# Patient Record
Sex: Female | Born: 1980 | Race: Black or African American | Hispanic: No | Marital: Single | Smoking: Current every day smoker
Health system: Southern US, Community
[De-identification: ages and names within clinical notes are randomized; demographics above are authoritative.]

## PROBLEM LIST (undated history)

## (undated) ENCOUNTER — Inpatient Hospital Stay (HOSPITAL_COMMUNITY): Payer: Self-pay

## (undated) DIAGNOSIS — O24419 Gestational diabetes mellitus in pregnancy, unspecified control: Secondary | ICD-10-CM

## (undated) DIAGNOSIS — D649 Anemia, unspecified: Secondary | ICD-10-CM

## (undated) DIAGNOSIS — I10 Essential (primary) hypertension: Secondary | ICD-10-CM

## (undated) DIAGNOSIS — R519 Headache, unspecified: Secondary | ICD-10-CM

## (undated) HISTORY — PX: EYE SURGERY: SHX253

## (undated) HISTORY — DX: Anemia, unspecified: D64.9

## (undated) HISTORY — PX: DILATION AND CURETTAGE OF UTERUS: SHX78

---
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
## 2002-02-27 ENCOUNTER — Inpatient Hospital Stay: Admission: AD | Admit: 2002-02-27 | Discharge: 2002-02-27 | Payer: Self-pay | Admitting: Obstetrics and Gynecology

## 2003-02-26 ENCOUNTER — Emergency Department (HOSPITAL_COMMUNITY): Admission: EM | Admit: 2003-02-26 | Discharge: 2003-02-26 | Payer: Self-pay | Admitting: Emergency Medicine

## 2004-03-09 ENCOUNTER — Inpatient Hospital Stay (HOSPITAL_COMMUNITY): Admission: AD | Admit: 2004-03-09 | Discharge: 2004-03-09 | Payer: Self-pay | Admitting: Obstetrics

## 2005-01-18 ENCOUNTER — Emergency Department (HOSPITAL_COMMUNITY): Admission: EM | Admit: 2005-01-18 | Discharge: 2005-01-18 | Payer: Self-pay | Admitting: Family Medicine

## 2005-11-04 ENCOUNTER — Inpatient Hospital Stay (HOSPITAL_COMMUNITY): Admission: AD | Admit: 2005-11-04 | Discharge: 2005-11-04 | Payer: Self-pay | Admitting: Obstetrics

## 2006-02-08 ENCOUNTER — Emergency Department (HOSPITAL_COMMUNITY): Admission: EM | Admit: 2006-02-08 | Discharge: 2006-02-08 | Payer: Self-pay | Admitting: Emergency Medicine

## 2006-05-04 ENCOUNTER — Emergency Department (HOSPITAL_COMMUNITY): Admission: EM | Admit: 2006-05-04 | Discharge: 2006-05-04 | Payer: Self-pay | Admitting: Family Medicine

## 2007-01-12 ENCOUNTER — Ambulatory Visit (HOSPITAL_COMMUNITY): Admission: RE | Admit: 2007-01-12 | Discharge: 2007-01-12 | Payer: Self-pay | Admitting: Obstetrics & Gynecology

## 2007-03-30 ENCOUNTER — Inpatient Hospital Stay (HOSPITAL_COMMUNITY): Admission: AD | Admit: 2007-03-30 | Discharge: 2007-03-30 | Payer: Self-pay | Admitting: Obstetrics & Gynecology

## 2007-04-16 ENCOUNTER — Ambulatory Visit (HOSPITAL_COMMUNITY): Admission: RE | Admit: 2007-04-16 | Discharge: 2007-04-16 | Payer: Self-pay | Admitting: Obstetrics & Gynecology

## 2007-05-24 ENCOUNTER — Inpatient Hospital Stay (HOSPITAL_COMMUNITY): Admission: RE | Admit: 2007-05-24 | Discharge: 2007-05-26 | Payer: Self-pay | Admitting: Obstetrics & Gynecology

## 2009-11-22 ENCOUNTER — Emergency Department (HOSPITAL_COMMUNITY): Admission: EM | Admit: 2009-11-22 | Discharge: 2009-11-22 | Payer: Self-pay | Admitting: Family Medicine

## 2010-03-11 ENCOUNTER — Emergency Department (HOSPITAL_COMMUNITY)
Admission: EM | Admit: 2010-03-11 | Discharge: 2010-03-11 | Payer: Self-pay | Source: Home / Self Care | Admitting: Family Medicine

## 2010-05-11 ENCOUNTER — Inpatient Hospital Stay (INDEPENDENT_AMBULATORY_CARE_PROVIDER_SITE_OTHER)
Admission: RE | Admit: 2010-05-11 | Discharge: 2010-05-11 | Disposition: A | Discharge status: Home / Self Care | Payer: Medicaid Other | Source: Ambulatory Visit | Attending: Family Medicine | Admitting: Family Medicine

## 2010-05-11 DIAGNOSIS — L02219 Cutaneous abscess of trunk, unspecified: Secondary | ICD-10-CM

## 2010-05-13 LAB — CULTURE, ROUTINE-ABSCESS

## 2010-07-20 NOTE — H&P (Signed)
NAMEJAVANA, [name] NO.:  [id number]   MEDICAL RECORD NO.:  [id number]          PATIENT TYPE:  INP   LOCATION:  9167                          FACILITY:  [facility]   PHYSICIAN:  Roseanna Rainbow, M.D.DATE OF BIRTH:  10-Jul-1980   DATE OF ADMISSION:  05/24/2007  DATE OF DISCHARGE:                              HISTORY & PHYSICAL   CHIEF COMPLAINT:  The patient is a 30 year old, P2 with an estimated  date of confinement of May 31, 2007, with an intrauterine pregnancy at  97 plus weeks with a history of chronic hypertension for induction of  labor.   HISTORY OF PRESENT ILLNESS:  Please see the above.   ALLERGIES:  No known drug allergies.   MEDICATIONS:  Please see the medication reconciliation form.   OB RISK FACTORS:  Rh negative, nonsensitized, iron-deficiency anemia and  chronic hypertension.   PRENATAL LABORATORY DATA:  Quad screen normal.  Chlamydia probe  negative.  Pap smear negative.  GC probe negative.  One-hour GTT 144.  Normal 3-hour GTT.  Hepatitis B surface antigen negative.  Hematocrit  36, hemoglobin 11.9.  HIV nonreactive.  Cystic fibrosis carrier testing  negative.  Platelets 260,000.  Blood type is A negative.  Antibody  screen was negative.  RPR nonreactive, rubella immune.  Sickle cell  negative.  GBS negative on February 20.   PAST GYNECOLOGICAL HISTORY:  History of D&C.   PAST MEDICAL HISTORY:  Please see the above.   PAST SURGICAL HISTORY:  No previous surgery.   SOCIAL HISTORY:  She is a Scientist, physiological.  She is single.  Denies any  alcohol, tobacco or drug use.   FAMILY HISTORY:  Adult-onset diabetes, Alzheimer's and hypertension.   PAST OBSTETRICAL HISTORY:  In January 2002, she delivered a live born  female, full term, 7 pounds 6 ounces, vaginal delivery.  In March 2000,  she delivered a live born female at 36 weeks, weight 5 pounds, vaginal  delivery.  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   PHYSICAL  EXAMINATION:  VITAL SIGNS:  Afebrile.  Blood pressure is  130/70s-80s.  Fetal heart tracing reassuring.  Tocodynamometer with  contractions every 2 minutes.  PELVIC:  Sterile vaginal exam with cervix 4 cm dilated, 80% effaced with  vertex at -2 station.  The membranes were artificially ruptured for  clear fluid.   ASSESSMENT:  1. Multipara at term.  2. History of chronic hypertension.  3. Early labor.  Fetal heart tracing consistent with fetal well-being.   PLAN:  Admission.  Continue induction of labor with low-dose Pitocin as  needed.      Roseanna Rainbow, M.D.  Electronically Signed     LAJ/MEDQ  D:  05/24/2007  T:  05/24/2007  Job:  [id number]

## 2010-08-18 ENCOUNTER — Emergency Department (HOSPITAL_COMMUNITY)
Admission: EM | Admit: 2010-08-18 | Discharge: 2010-08-18 | Disposition: A | Discharge status: Home / Self Care | Payer: No Typology Code available for payment source | Attending: Emergency Medicine | Admitting: Emergency Medicine

## 2010-08-18 DIAGNOSIS — S335XXA Sprain of ligaments of lumbar spine, initial encounter: Secondary | ICD-10-CM | POA: Insufficient documentation

## 2010-08-18 DIAGNOSIS — M545 Low back pain, unspecified: Secondary | ICD-10-CM | POA: Insufficient documentation

## 2010-08-19 ENCOUNTER — Emergency Department (HOSPITAL_COMMUNITY)
Admission: EM | Admit: 2010-08-19 | Discharge: 2010-08-19 | Disposition: A | Discharge status: Home / Self Care | Payer: No Typology Code available for payment source | Attending: Emergency Medicine | Admitting: Emergency Medicine

## 2010-08-19 DIAGNOSIS — M542 Cervicalgia: Secondary | ICD-10-CM | POA: Insufficient documentation

## 2010-08-19 DIAGNOSIS — S139XXA Sprain of joints and ligaments of unspecified parts of neck, initial encounter: Secondary | ICD-10-CM | POA: Insufficient documentation

## 2010-08-19 DIAGNOSIS — X58XXXA Exposure to other specified factors, initial encounter: Secondary | ICD-10-CM | POA: Insufficient documentation

## 2010-10-04 ENCOUNTER — Emergency Department (HOSPITAL_COMMUNITY)
Admission: EM | Admit: 2010-10-04 | Discharge: 2010-10-05 | Disposition: A | Discharge status: Home / Self Care | Payer: Medicaid Other | Attending: Emergency Medicine | Admitting: Emergency Medicine

## 2010-10-04 DIAGNOSIS — IMO0002 Reserved for concepts with insufficient information to code with codable children: Secondary | ICD-10-CM | POA: Insufficient documentation

## 2010-10-21 ENCOUNTER — Ambulatory Visit: Payer: Medicaid Other | Attending: Family Medicine | Admitting: Rehabilitative and Restorative Service Providers"

## 2010-10-21 ENCOUNTER — Ambulatory Visit: Payer: No Typology Code available for payment source

## 2010-10-21 DIAGNOSIS — M2569 Stiffness of other specified joint, not elsewhere classified: Secondary | ICD-10-CM | POA: Insufficient documentation

## 2010-10-21 DIAGNOSIS — IMO0001 Reserved for inherently not codable concepts without codable children: Secondary | ICD-10-CM | POA: Insufficient documentation

## 2010-10-21 DIAGNOSIS — M542 Cervicalgia: Secondary | ICD-10-CM | POA: Insufficient documentation

## 2010-10-21 DIAGNOSIS — R293 Abnormal posture: Secondary | ICD-10-CM | POA: Insufficient documentation

## 2010-10-25 ENCOUNTER — Encounter: Payer: No Typology Code available for payment source | Admitting: Rehabilitative and Restorative Service Providers"

## 2010-10-28 ENCOUNTER — Encounter: Payer: No Typology Code available for payment source | Admitting: Rehabilitative and Restorative Service Providers"

## 2010-11-02 ENCOUNTER — Encounter: Payer: No Typology Code available for payment source | Admitting: Physical Therapy

## 2010-11-03 ENCOUNTER — Ambulatory Visit: Payer: Medicaid Other | Admitting: Physical Therapy

## 2010-11-10 ENCOUNTER — Ambulatory Visit: Payer: Medicaid Other | Attending: Family Medicine | Admitting: Physical Therapy

## 2010-11-10 DIAGNOSIS — IMO0001 Reserved for inherently not codable concepts without codable children: Secondary | ICD-10-CM | POA: Insufficient documentation

## 2010-11-10 DIAGNOSIS — R293 Abnormal posture: Secondary | ICD-10-CM | POA: Insufficient documentation

## 2010-11-10 DIAGNOSIS — M2569 Stiffness of other specified joint, not elsewhere classified: Secondary | ICD-10-CM | POA: Insufficient documentation

## 2010-11-10 DIAGNOSIS — M542 Cervicalgia: Secondary | ICD-10-CM | POA: Insufficient documentation

## 2010-11-15 ENCOUNTER — Ambulatory Visit: Payer: Medicaid Other | Admitting: Physical Therapy

## 2010-11-17 ENCOUNTER — Encounter: Payer: No Typology Code available for payment source | Admitting: Physical Therapy

## 2010-11-23 ENCOUNTER — Ambulatory Visit: Payer: Medicaid Other | Admitting: Rehabilitative and Restorative Service Providers"

## 2010-11-25 LAB — RH IMMUNE GLOBULIN WORKUP (NOT WOMEN'S HOSP)

## 2010-11-29 LAB — CBC
HCT: 32 — ABNORMAL LOW
Hemoglobin: 10.8 — ABNORMAL LOW
Platelets: 245
RBC: 3.78 — ABNORMAL LOW
RBC: 4.28
WBC: 12.5 — ABNORMAL HIGH
WBC: 6.8

## 2010-11-29 LAB — RPR: RPR Ser Ql: NONREACTIVE

## 2010-11-29 LAB — RH IMMUNE GLOB WKUP(>/=20WKS)(NOT WOMEN'S HOSP)

## 2010-11-30 ENCOUNTER — Encounter: Payer: No Typology Code available for payment source | Admitting: Rehabilitative and Restorative Service Providers"

## 2010-12-01 ENCOUNTER — Ambulatory Visit: Payer: Medicaid Other | Admitting: Physical Therapy

## 2010-12-06 ENCOUNTER — Ambulatory Visit: Payer: Medicaid Other | Attending: Family Medicine | Admitting: Physical Therapy

## 2010-12-06 DIAGNOSIS — R293 Abnormal posture: Secondary | ICD-10-CM | POA: Insufficient documentation

## 2010-12-06 DIAGNOSIS — IMO0001 Reserved for inherently not codable concepts without codable children: Secondary | ICD-10-CM | POA: Insufficient documentation

## 2010-12-06 DIAGNOSIS — M542 Cervicalgia: Secondary | ICD-10-CM | POA: Insufficient documentation

## 2010-12-06 DIAGNOSIS — M2569 Stiffness of other specified joint, not elsewhere classified: Secondary | ICD-10-CM | POA: Insufficient documentation

## 2010-12-08 ENCOUNTER — Encounter: Payer: No Typology Code available for payment source | Admitting: Physical Therapy

## 2010-12-13 ENCOUNTER — Encounter: Payer: No Typology Code available for payment source | Admitting: Physical Therapy

## 2010-12-15 ENCOUNTER — Encounter: Payer: No Typology Code available for payment source | Admitting: Physical Therapy

## 2010-12-21 ENCOUNTER — Ambulatory Visit: Payer: Medicaid Other | Admitting: Physical Therapy

## 2010-12-23 ENCOUNTER — Ambulatory Visit: Payer: No Typology Code available for payment source

## 2010-12-27 ENCOUNTER — Encounter: Payer: No Typology Code available for payment source | Admitting: Physical Therapy

## 2011-03-18 ENCOUNTER — Encounter (HOSPITAL_COMMUNITY): Payer: Self-pay | Admitting: Emergency Medicine

## 2011-03-18 ENCOUNTER — Emergency Department (HOSPITAL_COMMUNITY)
Admission: EM | Admit: 2011-03-18 | Discharge: 2011-03-18 | Disposition: A | Discharge status: Nursing Home (Includes ICF) | Payer: Medicaid Other | Source: Home / Self Care | Attending: Emergency Medicine | Admitting: Emergency Medicine

## 2011-03-18 DIAGNOSIS — J029 Acute pharyngitis, unspecified: Secondary | ICD-10-CM

## 2011-03-18 DIAGNOSIS — K1370 Unspecified lesions of oral mucosa: Secondary | ICD-10-CM

## 2011-03-18 DIAGNOSIS — K1379 Other lesions of oral mucosa: Secondary | ICD-10-CM

## 2011-03-18 LAB — POCT RAPID STREP A: Streptococcus, Group A Screen (Direct): NEGATIVE

## 2011-03-18 NOTE — ED Provider Notes (Signed)
History     CSN: [id number]  Arrival date & time 03/18/11  4098   First MD Initiated Contact with Patient 03/18/11 1003      Chief Complaint  Patient presents with  . Sore Throat  . Influenza     HPI Comments: Pt with ST starting several days ago. Also with atruamatic swelling upper right soft palate, bodyaches, fatigue. No voice changes, difficulty swallowing, trismus, drooling, ear pain, fevers, rash, abd pain. Has not tried anything for this.   Patient is a 31 y.o. female presenting with pharyngitis and flu symptoms. The history is provided by the patient.  Sore Throat This is a new problem. The symptoms are aggravated by swallowing.  Influenza    History reviewed. No pertinent past medical history.  Past Surgical History  Procedure Date  . Tubal ligation     History reviewed. No pertinent family history.  History  Substance Use Topics  . Smoking status: Never Smoker   . Smokeless tobacco: Not on file  . Alcohol Use: No    OB History    Grav Para Term Preterm Abortions TAB SAB Ect Mult Living                  Review of Systems  Constitutional: Positive for fatigue. Negative for fever.  HENT: Positive for sore throat and neck pain. Negative for congestion, rhinorrhea, drooling, mouth sores, trouble swallowing, dental problem, voice change, postnasal drip and ear discharge.   Gastrointestinal: Negative for nausea.  Skin: Negative for rash.    Allergies  Review of patient's allergies indicates no known allergies.  Home Medications  No current outpatient prescriptions on file.  BP 104/64  Pulse 81  Temp(Src) 97.9 F (36.6 C) (Oral)  Resp 16  SpO2 100%  LMP 03/16/2011  Physical Exam  Nursing note and vitals reviewed. Constitutional: She is oriented to person, place, and time. She appears well-developed and well-nourished. No distress.  HENT:  Head: Normocephalic and atraumatic. No trismus in the jaw.  Right Ear: Tympanic membrane normal.  Left  Ear: Tympanic membrane normal.  Mouth/Throat: Uvula is midline and mucous membranes are normal. No oral lesions.         2.5 x 2.5 nontender flucutant mass where soft and hard palate meet, some exudates tonsils. Otherwise tonsils WNL. Voice normal  Eyes: Conjunctivae and EOM are normal. Pupils are equal, round, and reactive to light.  Neck: Normal range of motion. Neck supple.  Cardiovascular: Regular rhythm.   Pulmonary/Chest: Effort normal.  Abdominal: She exhibits no distension.  Musculoskeletal: Normal range of motion.  Lymphadenopathy:    She has no cervical adenopathy.  Neurological: She is alert and oriented to person, place, and time.  Skin: Skin is warm and dry.  Psychiatric: She has a normal mood and affect. Her behavior is normal. Judgment and thought content normal.    ED Course  Procedures (including critical care time)   Labs Reviewed  POCT RAPID STREP A ([facility] ONLY)   No results found.   1. Soft palate edema   2. Pharyngitis       MDM  Concern for atypical presentation of peritonsillar abscess. Pt declined medication here. Transferring for further evaluation.   Luiz Blare, MD 03/18/11 1108

## 2011-03-18 NOTE — ED Notes (Signed)
HERE WITH SORE THROAT,DIFF SWALLOWING AND NOW BODY ACHES THAT STARTED YESTERDAY.DENIES FEVER,CHILLS,N,V.ON EXAM PPT HAS ABSCESS TO RIGHT UVULA BUT DENIES PAIN WITH TOUCH.PT ALSO REPORT OF INTERMITT H/A

## 2011-03-20 ENCOUNTER — Encounter (HOSPITAL_COMMUNITY): Payer: Self-pay | Admitting: *Deleted

## 2011-03-20 ENCOUNTER — Emergency Department (HOSPITAL_COMMUNITY)
Admission: EM | Admit: 2011-03-20 | Discharge: 2011-03-20 | Disposition: A | Discharge status: Home / Self Care | Payer: Medicaid Other | Attending: Emergency Medicine | Admitting: Emergency Medicine

## 2011-03-20 DIAGNOSIS — R220 Localized swelling, mass and lump, head: Secondary | ICD-10-CM | POA: Insufficient documentation

## 2011-03-20 DIAGNOSIS — J029 Acute pharyngitis, unspecified: Secondary | ICD-10-CM | POA: Insufficient documentation

## 2011-03-20 MED ORDER — AMOXICILLIN-POT CLAVULANATE 500-125 MG PO TABS: 1.0000 | ORAL_TABLET | Freq: Three times a day (TID) | ORAL | Status: AC

## 2011-03-20 NOTE — ED Notes (Signed)
Pt from home with c/o sore throat for 2-3 days, denies fever or head congestion, "hurts when I swallow".

## 2011-03-20 NOTE — ED Provider Notes (Signed)
History     CSN: [id number]  Arrival date & time 03/20/11  1012   First MD Initiated Contact with Patient 03/20/11 1024      Chief Complaint  Patient presents with  . Sore Throat    (Consider location/radiation/quality/duration/timing/severity/associated sxs/prior treatment) HPI Comments: Patient comes in today with chief complaint of sore throat.  She was seen by [facility] 2 days ago for same complaint and was recommended to follow up in the ED due to a concern for atypical peritonsillar abscess.  Review of the note from [facility] shows that a mass was identified on the soft palate.  Patient is unaware of the mass and does not know how long it has been there.  Patient has had a sore throat for the past 2 days.  Some pain with swallowing.  No trismus, no difficulty swallowing, no drooling, no fever.  She reports that her pain have improved since she was seen by [facility] two days ago.  Patient is a 31 y.o. female presenting with pharyngitis. The history is provided by the patient.  Sore Throat Associated symptoms include a sore throat. Pertinent negatives include no chills, congestion, coughing, fever, neck pain or rash.    History reviewed. No pertinent past medical history.  History reviewed. No pertinent past surgical history.  Family History  Problem Relation Age of Onset  . Hyperlipidemia Mother     History  Substance Use Topics  . Smoking status: Current Some Day Smoker    Types: Cigarettes  . Smokeless tobacco: Never Used  . Alcohol Use: Yes     occ    OB History    Grav Para Term Preterm Abortions TAB SAB Ect Mult Living                  Review of Systems  Constitutional: Negative for fever and chills.  HENT: Positive for sore throat. Negative for ear pain, congestion, rhinorrhea, drooling, trouble swallowing, neck pain, neck stiffness, voice change and sinus pressure.   Respiratory: Negative for cough and shortness of breath.   Skin: Negative for rash.    Allergies    Review of patient's allergies indicates no known allergies.  Home Medications  No current outpatient prescriptions on file.  BP 122/73  Pulse 103  Temp(Src) 97.9 F (36.6 C) (Oral)  Resp 18  SpO2 100%  LMP 03/16/2011  Physical Exam  Nursing note and vitals reviewed. Constitutional: She is oriented to person, place, and time. She appears well-developed and well-nourished. No distress.  HENT:  Head: Normocephalic and atraumatic. No trismus in the jaw.  Right Ear: Tympanic membrane normal.  Left Ear: Tympanic membrane normal.  Nose: Mucosal edema present.  Mouth/Throat: Uvula is midline and mucous membranes are normal. No uvula swelling. Posterior oropharyngeal erythema present. No oropharyngeal exudate, posterior oropharyngeal edema or tonsillar abscesses.    Neck: Normal range of motion. Neck supple.  Cardiovascular: Normal rate, regular rhythm and normal heart sounds.   Pulmonary/Chest: Effort normal and breath sounds normal.  Neurological: She is alert and oriented to person, place, and time.  Skin: Skin is warm and dry. She is not diaphoretic. No erythema.  Psychiatric: She has a normal mood and affect.    ED Course  Procedures (including critical care time)   Labs Reviewed  RAPID STREP SCREEN   No results found.   No diagnosis found.  11:25 AM Discussed patient with Dr. Annalee Genta with ENT.  He recommends placing the patient on Augmentin and following up with patient  in the office tomorrow.    MDM  Patient not exhibiting any signs or symptoms of peritonsillar abscess. Patient not in any acute distress.  She does have a soft palate mass on the roof of her mouth that feels fluctuant.  Will have patient follow up with ENT for further evaluation and management.        [facility] 03/20/11 2123

## 2011-03-20 NOTE — ED Notes (Signed)
MD at bedside. 

## 2011-03-23 NOTE — ED Provider Notes (Signed)
Medical screening examination/treatment/procedure(s) were performed by non-physician practitioner and as supervising physician I was immediately available for consultation/collaboration.  Toy Baker, MD 03/23/11 [phone]

## 2011-03-24 ENCOUNTER — Ambulatory Visit (INDEPENDENT_AMBULATORY_CARE_PROVIDER_SITE_OTHER): Payer: Medicaid Other | Admitting: Obstetrics & Gynecology

## 2011-03-24 ENCOUNTER — Encounter: Payer: Self-pay | Admitting: Obstetrics & Gynecology

## 2011-03-24 ENCOUNTER — Other Ambulatory Visit (HOSPITAL_COMMUNITY)
Admission: RE | Admit: 2011-03-24 | Discharge: 2011-03-24 | Disposition: A | Discharge status: Home / Self Care | Payer: Medicaid Other | Source: Ambulatory Visit | Attending: Obstetrics & Gynecology | Admitting: Obstetrics & Gynecology

## 2011-03-24 VITALS — BP 128/82 | HR 84 | Temp 99.1°F | Ht 65.0 in | Wt 178.6 lb

## 2011-03-24 DIAGNOSIS — Z113 Encounter for screening for infections with a predominantly sexual mode of transmission: Secondary | ICD-10-CM | POA: Insufficient documentation

## 2011-03-24 DIAGNOSIS — Z308 Encounter for other contraceptive management: Secondary | ICD-10-CM

## 2011-03-24 DIAGNOSIS — Z30019 Encounter for initial prescription of contraceptives, unspecified: Secondary | ICD-10-CM

## 2011-03-24 DIAGNOSIS — R87810 Cervical high risk human papillomavirus (HPV) DNA test positive: Secondary | ICD-10-CM | POA: Insufficient documentation

## 2011-03-24 DIAGNOSIS — Z309 Encounter for contraceptive management, unspecified: Secondary | ICD-10-CM | POA: Insufficient documentation

## 2011-03-24 DIAGNOSIS — Z01419 Encounter for gynecological examination (general) (routine) without abnormal findings: Secondary | ICD-10-CM | POA: Insufficient documentation

## 2011-03-24 MED ORDER — MEDROXYPROGESTERONE ACETATE 150 MG/ML IM SUSP
150.0000 mg | INTRAMUSCULAR | Status: AC
  Administered 2011-03-24 – 2011-10-27 (×3): 150 mg via INTRAMUSCULAR

## 2011-03-24 NOTE — Progress Notes (Signed)
States had pap last year with Dr. Tamela Oddi

## 2011-03-24 NOTE — Progress Notes (Signed)
  Subjective:    Patient ID: [name], female    DOB: 1980-05-10, 31 y.o.   MRN: [id number]  [id number] Patient's last menstrual period was 03/16/2011. Patient wants to start Depo Provera. She has used this previously. History reviewed. No pertinent past medical history. History reviewed. No pertinent past surgical history. Tab x 8 No Known Allergies History   Social History  . Marital Status: Single    Spouse Name: N/A    Number of Children: N/A  . Years of Education: N/A   Occupational History  . Not on file.   Social History Main Topics  . Smoking status: Current Some Day Smoker    Types: Cigarettes  . Smokeless tobacco: Never Used  . Alcohol Use: Yes     occ  . Drug Use: No  . Sexually Active: Yes    Birth Control/ Protection: None   Other Topics Concern  . Not on file   Social History Narrative  . No narrative on file   Family History  Problem Relation Age of Onset  . Hyperlipidemia Mother    Current Outpatient Prescriptions on File Prior to Visit  Medication Sig Dispense Refill  . amoxicillin-clavulanate (AUGMENTIN) 500-125 MG per tablet Take 1 tablet (500 mg total) by mouth every 8 (eight) hours.  21 tablet  0     Review of Systems Regular menses, no discharge or pain    Objective:   Physical Exam  Abdominal: Soft. She exhibits no distension and no mass. There is no tenderness.  Genitourinary: Vagina normal and uterus normal.       Pap done, STD testing. No masses   Filed Vitals:   03/24/11 1347  Height: 5\' 5"  (1.651 m)  Weight: 178 lb 9.6 oz (81.012 kg)     NAD, pleasant      Assessment & Plan:  H/O multiple terminations. Needs BCM Depo Provera IM q80mo Bev Drennen

## 2011-04-04 ENCOUNTER — Telehealth: Payer: Self-pay | Admitting: *Deleted

## 2011-04-04 NOTE — Telephone Encounter (Signed)
Spoke with pt and advised her of results and appt. Pt agrees with plan.

## 2011-04-04 NOTE — Telephone Encounter (Signed)
Pt had pap with ASCUS +HPV. Colpo appt made for 04/21/11 at 230. Called patient and left her a message to call us back. She needs to know about appointment and explanation of colpo.

## 2011-04-04 NOTE — Telephone Encounter (Signed)
Message copied by Mannie Stabile on Mon Apr 04, 2011  1:40 PM ------      Message from: Zola Button L      Created: Mon Apr 04, 2011  1:33 PM      Regarding: colpo appointment       Her Colpo appt is set for Thursday, 04/21/11 @ 2:30 pm.                        Please give an appointment date for colpo. Then send back to clinical pool and we can call patient.             Thanks!

## 2011-04-21 ENCOUNTER — Ambulatory Visit (INDEPENDENT_AMBULATORY_CARE_PROVIDER_SITE_OTHER): Payer: Medicaid Other | Admitting: Family Medicine

## 2011-04-21 ENCOUNTER — Other Ambulatory Visit (HOSPITAL_COMMUNITY)
Admission: RE | Admit: 2011-04-21 | Discharge: 2011-04-21 | Disposition: A | Discharge status: Home / Self Care | Payer: Medicaid Other | Source: Ambulatory Visit | Attending: Family Medicine | Admitting: Family Medicine

## 2011-04-21 ENCOUNTER — Encounter: Payer: Self-pay | Admitting: Family Medicine

## 2011-04-21 DIAGNOSIS — R87811 Vaginal high risk human papillomavirus (HPV) DNA test positive: Secondary | ICD-10-CM

## 2011-04-21 DIAGNOSIS — IMO0002 Reserved for concepts with insufficient information to code with codable children: Secondary | ICD-10-CM | POA: Insufficient documentation

## 2011-04-21 DIAGNOSIS — R87619 Unspecified abnormal cytological findings in specimens from cervix uteri: Secondary | ICD-10-CM | POA: Insufficient documentation

## 2011-04-21 DIAGNOSIS — Z01812 Encounter for preprocedural laboratory examination: Secondary | ICD-10-CM

## 2011-04-21 NOTE — Patient Instructions (Signed)
Colposcopy Care After Colposcopy is a procedure in which a special tool is used to magnify the surface of the cervix. A tissue sample (biopsy) may also be taken. This sample will be looked at for cervical cancer or other problems. After the test:  You may have some cramping.   Lie down for a few minutes if you feel lightheaded.    You may have some bleeding which should stop in a few days.  HOME CARE  Do not have sex or use tampons for 2 to 3 days or as told.   Only take medicine as told by your doctor.   Continue to take your birth control pills as usual.  Finding out the results of your test Ask when your test results will be ready. Make sure you get your test results. GET HELP RIGHT AWAY IF:  You are bleeding a lot or are passing blood clots.   You develop a fever of 102 F (38.9 C) or higher.   You have abnormal vaginal discharge.   You have cramps that do not go away with medicine.   You feel lightheaded, dizzy, or pass out (faint).  MAKE SURE YOU:   Understand these instructions.   Will watch your condition.   Will get help right away if you are not doing well or get worse.  Document Released: 08/10/2007 Document Revised: 11/03/2010 Document Reviewed: 08/10/2007 ExitCare Patient Information 2012 [employer]. 

## 2011-04-21 NOTE — Progress Notes (Signed)
Patient given informed consent, signed copy in the chart, time out was performed.  Placed in lithotomy position. Cervix viewed with speculum and colposcope after application of acetic acid.   Colposcopy adequate?  yes Acetowhite lesions?12 o'clock Punctation?no Mosaicism?  no Abnormal vasculature?  no Biopsies?12 o'clock ECC?yes  Patient was given post procedure instructions.  She will return in 2 weeks for results.

## 2011-05-11 ENCOUNTER — Ambulatory Visit: Payer: Medicaid Other | Admitting: Obstetrics and Gynecology

## 2011-06-22 ENCOUNTER — Ambulatory Visit (INDEPENDENT_AMBULATORY_CARE_PROVIDER_SITE_OTHER): Payer: Medicaid Other | Admitting: *Deleted

## 2011-06-22 VITALS — BP 114/80 | HR 85 | Temp 97.1°F

## 2011-06-22 DIAGNOSIS — Z3049 Encounter for surveillance of other contraceptives: Secondary | ICD-10-CM

## 2011-06-22 DIAGNOSIS — Z309 Encounter for contraceptive management, unspecified: Secondary | ICD-10-CM

## 2011-09-07 ENCOUNTER — Ambulatory Visit: Payer: Medicaid Other

## 2011-09-12 ENCOUNTER — Ambulatory Visit: Payer: Medicaid Other

## 2011-10-27 ENCOUNTER — Other Ambulatory Visit (INDEPENDENT_AMBULATORY_CARE_PROVIDER_SITE_OTHER): Payer: Self-pay | Admitting: General Practice

## 2011-10-27 VITALS — BP 129/86 | HR 91 | Temp 97.5°F | Ht 65.0 in | Wt 183.8 lb

## 2011-10-27 DIAGNOSIS — Z309 Encounter for contraceptive management, unspecified: Secondary | ICD-10-CM

## 2011-10-27 DIAGNOSIS — Z3049 Encounter for surveillance of other contraceptives: Secondary | ICD-10-CM

## 2011-10-27 NOTE — Progress Notes (Signed)
Patient came in to have pregnancy test because she missed her depo provera on 7/7 and had unprotected sex the end of July. Patient has not had unprotected sex since the- pregnancy test negative.

## 2012-01-12 ENCOUNTER — Encounter: Payer: Self-pay | Admitting: Obstetrics & Gynecology

## 2012-01-12 ENCOUNTER — Ambulatory Visit (INDEPENDENT_AMBULATORY_CARE_PROVIDER_SITE_OTHER): Payer: Medicaid Other

## 2012-01-12 ENCOUNTER — Other Ambulatory Visit (HOSPITAL_COMMUNITY)
Admission: RE | Admit: 2012-01-12 | Discharge: 2012-01-12 | Disposition: A | Discharge status: Home / Self Care | Payer: Medicaid Other | Source: Ambulatory Visit | Attending: Obstetrics & Gynecology | Admitting: Obstetrics & Gynecology

## 2012-01-12 ENCOUNTER — Ambulatory Visit (INDEPENDENT_AMBULATORY_CARE_PROVIDER_SITE_OTHER): Payer: Medicaid Other | Admitting: Obstetrics & Gynecology

## 2012-01-12 VITALS — BP 124/78 | HR 82 | Temp 97.5°F | Ht 66.0 in | Wt 182.1 lb

## 2012-01-12 DIAGNOSIS — Z3049 Encounter for surveillance of other contraceptives: Secondary | ICD-10-CM

## 2012-01-12 DIAGNOSIS — N870 Mild cervical dysplasia: Secondary | ICD-10-CM

## 2012-01-12 DIAGNOSIS — N898 Other specified noninflammatory disorders of vagina: Secondary | ICD-10-CM

## 2012-01-12 DIAGNOSIS — N760 Acute vaginitis: Secondary | ICD-10-CM | POA: Insufficient documentation

## 2012-01-12 MED ORDER — MEDROXYPROGESTERONE ACETATE 150 MG/ML IM SUSP
150.0000 mg | Freq: Once | INTRAMUSCULAR | Status: AC
  Administered 2012-01-12: 150 mg via INTRAMUSCULAR

## 2012-01-12 MED ORDER — METRONIDAZOLE 500 MG PO TABS: ORAL_TABLET | ORAL | Status: DC

## 2012-01-12 NOTE — Progress Notes (Signed)
  Subjective:    Patient ID: [name], female    DOB: 28-Jul-1980, 31 y.o.   MRN: [id number]  HPINo LMP recorded. Patient has had an injection. [id number] Several days of malodorous vaginal discharge    Review of Systems  Constitutional: Negative for fever.  Gastrointestinal: Negative for abdominal pain.  Genitourinary: Positive for vaginal discharge. Negative for dysuria, urgency and vaginal bleeding.       Objective:   Physical Exam  Constitutional: No distress.  Pulmonary/Chest: Effort normal. No respiratory distress.  Genitourinary: Uterus normal.       Vaginal discharge c/w BV. No mass or tenderness. Wet prep done  Skin: Skin is warm.  Psychiatric: She has a normal mood and affect. Her behavior is normal.          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 For pap and cotest  Flagyl 2 g PO single dose  ARNOLD,JAMES 01/12/2012 1:33 PM

## 2012-01-12 NOTE — Patient Instructions (Signed)
Cervical Dysplasia Cervical dysplasia is a condition in which a woman has abnormal changes in the cells of her cervix. The cervix is the opening to the uterus (womb) between the vagina and the uterus. These changes are called cervical dysplasia and may be the first signs of cervical cancer. These cells can be taken from the cervix during a Pap test and then looked at under a microscope. With early detection, treatment, and close follow-up care, nearly all cervical dysplasia can be cured. If untreated, the mild to moderate stages of dysplasia often grow more severe.  RISK FACTORS  The following increase the risk for cervical dysplasia.  Having had a sexually transmitted disease, including:  Chlamydia.  Human papilloma virus (HPV).  Becoming sexually active before age 18.  Having had more than 1 sexual partner.  Not using protection, such as condoms, during sexual intercourse, especially with new sexual partners.  Having had cancer of the vagina or vulva.  Having a sexual partner whose previous partner had cancer of the cervix or cervical dysplasia.  Having a sexual partner who has or has had cancer of the penis.  Having a weakened immune system (HIV, organ transplant).  Being the daughter of a woman who took DES (diethylstilbestrol) during pregnancy.  A history of cervical cancer in a woman's sister or mother.  Smoking.  Having had an abnormal Pap test in the past. SYMPTOMS  There are usually no symptoms. If there are symptoms, they may be vague such as:  Abnormal vaginal discharge.  Bleeding between periods or following intercourse.  Bleeding during menopause.  Pain on intercourse (dyspareunia). DIAGNOSIS   The Pap test is the best way of detecting abnormalities of the cervix.  Biopsy (removing a piece of tissue to look at under the microscope) of the cervix when the Pap test is abnormal or when the Pap test is normal, but the cervix looks abnormal. TREATMENT    Catching and treating the changes early with Pap tests can prevent cervical cancer.  Cryotherapy freezes the abnormal cells with a steel tip instrument.  A laser can be used to remove the abnormal cells.  Loop electrocautery excision procedure (LEEP). This procedure uses a heated electrical loop to remove a cone-like portion of the cervix, including the cervical canal.  For more serious cases of cervical dysplasia, the abnormal tissue may be removed surgically by:  A cone biopsy (by cold knife, laser or LEEP). A procedure in which a portion of the center of the cervix with the cervical canal is removed.  The uterus and cervix are removed (hysterectomy). Your caregiver will advise you regarding the need and timing of Pap tests in your follow-up. Women who have been treated for dysplasia should be closely followed with pelvic exams and Pap tests. During the first year following treatment of cervical dysplasia, Pap tests should be done every 3 to 4 months. In the second year, the schedule is every 6 months, or as recommended by your caregiver. See your caregiver for new or worsening problems. HOME CARE INSTRUCTIONS   Follow the instructions and recommendations of your caregiver regarding medicines and follow-up appointments.  Only take over-the-counter or prescription medicines for pain or discomfort as directed by your caregiver.  Cramping and pelvic discomfort may follow cryotherapy. It is not abnormal to have watery discharge for several weeks after.  Laser, cone surgery, cryotherapy or LEEP can cause a bad smelling vaginal discharge. It may also cause vaginal bleeding for a couple weeks following the procedure. The   discharge may be black from the paste used to control bleeding from the cone site. This is normal.  Do not use tampons, have sexual intercourse or douche until your caregiver says it is okay. SEEK MEDICAL CARE IF:   You develop genital warts.  You need a prescription for  pain medicine following your treatment. SEEK IMMEDIATE MEDICAL CARE IF:   Your bleeding is heavier than a normal menstrual period.  You develop bright red bleeding, especially if you have blood clots.  You have a fever.  You have increasing cramps or pain not relieved with medicine.  You are lightheaded, unusually weak, or have fainting spells.  You have abnormal vaginal discharge.  You develop abdominal pain. PREVENTION   The surest way to prevent cervical dysplasia is to abstain from sexual intercourse.  Practice safe sex, use condoms and have only one sex partner who does not have other sex partners.  A Pap test is done to screen for cervical cancer.  The first Pap test should be done at age 21.  Between ages 21 and 29, Pap tests are repeated every 2 years.  Beginning at age 30, you are advised to have a Pap test every 3 years as long as your past 3 Pap tests have been normal.  Some women have medical problems that increase the chance of getting cervical cancer. Talk to your caregiver about these problems. It is especially important to talk to your caregiver if a new problem develops soon after your last Pap test. In these cases, your caregiver may recommend more frequent screening and Pap tests.  The above recommendations are the same for women who have or have not gotten the vaccine for HPV (Human Papillomavirus).  If you had a hysterectomy for a problem that was not a cancer or a condition that could lead to cancer, then you no longer need Pap tests. However, even if you no longer need a Pap test, a regular exam is a good idea to make sure no other problems are starting.   If you are between ages 65 and 70, and you have had normal Pap tests going back 10 years, you no longer need Pap tests. However, even if you no longer need a Pap test, a regular exam is a good idea to make sure no other problems are starting.   If you have had past treatment for cervical cancer or a  condition that could lead to cancer, you need Pap tests and screening for cancer for at least 20 years after your treatment.  If Pap tests have been discontinued, risk factors (such as a new sexual partner) need to be re-assessed to determine if screening should be resumed.  Some women may need screenings more often if they are at high risk for cervical cancer.  Your caregiver may do additional tests including:  Colposcopy. A procedure in which a special microscope magnifies the cells and allows the provider to closely examine the cervix, vagina, and vulva.  Biopsy. A small tissue sample is taken from the cervix, vagina or vulva. This is generally done in your caregivers office.  A cone biopsy (cold knife or laser). A large tissue sample is taken from the cervix. This procedure is usually done in an operating room under a general anesthetic. The cone often removes all abnormal tissue and so may also complete the treatment.  LEEP, also removing a circular portion of the cervix and is done in a doctors office under a local anesthetic.  Now   there is a vaccine, Gardasil, that was developed to prevent the HPV'S that can cause cancer of the cervix and genital warts. It is recommended for females ages 9 to 26. It should not be given to pregnant women until more is known about its effects on the fetus. Not all cancers of the cervix are caused by the HPV. Routine gynecology exams and Pap tests should continue as recommended by your caregiver. Document Released: 02/21/2005 Document Revised: 05/16/2011 Document Reviewed: 02/13/2008 Exit[employer] Patient Information 2013 [employer].  

## 2012-01-14 ENCOUNTER — Emergency Department (HOSPITAL_COMMUNITY)
Admission: EM | Admit: 2012-01-14 | Discharge: 2012-01-14 | Disposition: A | Discharge status: Home / Self Care | Payer: Medicaid Other | Attending: Emergency Medicine | Admitting: Emergency Medicine

## 2012-01-14 ENCOUNTER — Emergency Department (HOSPITAL_COMMUNITY): Payer: Medicaid Other

## 2012-01-14 ENCOUNTER — Encounter (HOSPITAL_COMMUNITY): Payer: Self-pay | Admitting: Physical Medicine and Rehabilitation

## 2012-01-14 DIAGNOSIS — M272 Inflammatory conditions of jaws: Secondary | ICD-10-CM

## 2012-01-14 DIAGNOSIS — F17200 Nicotine dependence, unspecified, uncomplicated: Secondary | ICD-10-CM | POA: Insufficient documentation

## 2012-01-14 MED ORDER — IOHEXOL 300 MG/ML  SOLN
75.0000 mL | Freq: Once | INTRAMUSCULAR | Status: AC | PRN
  Administered 2012-01-14: 75 mL via INTRAVENOUS

## 2012-01-14 MED ORDER — CLINDAMYCIN HCL 150 MG PO CAPS: 150.0000 mg | ORAL_CAPSULE | Freq: Four times a day (QID) | ORAL | Status: DC

## 2012-01-14 MED ORDER — OXYCODONE-ACETAMINOPHEN 5-325 MG PO TABS
1.0000 | ORAL_TABLET | Freq: Once | ORAL | Status: AC
  Administered 2012-01-14: 1 via ORAL
  Filled 2012-01-14: qty 1

## 2012-01-14 MED ORDER — HYDROCODONE-ACETAMINOPHEN 5-500 MG PO TABS: 1.0000 | ORAL_TABLET | Freq: Four times a day (QID) | ORAL | Status: DC | PRN

## 2012-01-14 NOTE — ED Notes (Signed)
Has had problem almost year, seen at urgent care, referred to ED, went to [facility], referred to ENT doc, not seen because no insurance, pt states didn't hurt so let it go, states 3 days ago started to hurt, unable to swallow food, eating apple sauce and yogurt, no drainage noted, no pain meds taken

## 2012-01-14 NOTE — ED Notes (Signed)
Pt presents to department for evaluation of abscess to upper mouth. Ongoing for several months. 10/10 pain at the time. No drainage noted. Pt states pain became worse this morning. States has been seen at [facility] for same issue in the past. NAD.

## 2012-01-14 NOTE — ED Notes (Signed)
Pt is in CT

## 2012-01-14 NOTE — ED Provider Notes (Addendum)
History    This chart was scribed for Gwyneth Sprout, MD, MD by Smitty Pluck, ED Scribe. The patient was seen in room TR05C and the patient's care was started at 12:30PM.   CSN: [id number]  Arrival date & time 01/14/12  1205  Chief Complaint  Patient presents with  . Abscess    (Consider location/radiation/quality/duration/timing/severity/associated sxs/prior treatment) Patient is a 31 y.o. female presenting with abscess. The history is provided by the patient. No language interpreter was used.  Abscess  Pertinent negatives include no fever and no vomiting.   [name] is a 31 y.o. female who presents to the Emergency Department complaining of constant, moderate upper mouth pain due to abscess onset 8 months ago. Pt reports symptoms have worsened since onset. She reports that pain radiates to her right ear.  She denies drainage. Pt was seen in [facility] 8 months ago for same issue. Denies any other pain.   No past medical history on file.  No past surgical history on file.  Family History  Problem Relation Age of Onset  . Hyperlipidemia Mother     History  Substance Use Topics  . Smoking status: Current Some Day Smoker    Types: Cigarettes  . Smokeless tobacco: Never Used  . Alcohol Use: Yes     Comment: occ    OB History    Grav Para Term Preterm Abortions TAB SAB Ect Mult Living   11 3 2 1 8 8    3       Review of Systems  Constitutional: Negative for fever and chills.  Respiratory: Negative for shortness of breath.   Gastrointestinal: Negative for nausea and vomiting.  Neurological: Negative for weakness.  All other systems reviewed and are negative.    Allergies  Review of patient's allergies indicates no known allergies.  Home Medications   Current Outpatient Rx  Name  Route  Sig  Dispense  Refill  . MEDROXYPROGESTERONE ACETATE 150 MG/ML IM SUSP   Intramuscular   Inject 150 mg into the muscle every 3 (three) months.           BP  141/94  Pulse 88  Temp 98.3 F (36.8 C) (Oral)  Resp 18  SpO2 100%  Physical Exam  Nursing note and vitals reviewed. Constitutional: She is oriented to person, place, and time. She appears well-developed and well-nourished. No distress.  HENT:  Head: Normocephalic and atraumatic.  Mouth/Throat:         Erythema, Pointing, Fluctuance and Pain Nl uvula Nl tonsils Nl dentition   Eyes: EOM are normal.  Neck: Neck supple. No tracheal deviation present.  Pulmonary/Chest: Effort normal. No respiratory distress.  Musculoskeletal: Normal range of motion.  Neurological: She is alert and oriented to person, place, and time.  Skin: Skin is warm and dry.  Psychiatric: She has a normal mood and affect. Her behavior is normal.    ED Course  Procedures (including critical care time) DIAGNOSTIC STUDIES: Oxygen Saturation is 100% on room air, normal by my interpretation.    COORDINATION OF CARE: 12:32 PM Discussed ED treatment with pt     Labs Reviewed - No data to display Ct Soft Tissue Neck W Contrast  01/14/2012  *RADIOLOGY REPORT*  Clinical Data: Abscess in the region of the palate/tonsil.  Pain. Difficulty swallowing.  CT NECK WITH CONTRAST  Technique:  Multidetector CT imaging of the neck was performed with intravenous contrast.  Contrast: 75mL OMNIPAQUE IOHEXOL 300 MG/ML  SOLN  Comparison: None.  Findings: Lung apices are clear.  No superior mediastinal pathology.  Limited visualization of the intracranial contents does not show any abnormality.  Both parotid glands are normal.  Both submandibular glands are normal.  The thyroid gland is normal.  There is a centrally nonenhancing fluid collection within the soft palate to the right of midline measuring 2 x 1.7 x 1.5 cm consistent with an abscess.  No evidence of extension into the other deep spaces of the neck.  There is a posterior level II lymph node on the right which is enlarged measuring up to 2.6 cm in size with an area of  suppuration.  No other enlarged or suppurative nodes.  Vascular structures appear normal.  IMPRESSION: 2 x 1.7 x 1.5 cm abscess of the soft palate on the right to.  A single enlarged to posterior level II lymph node on the right with an area of suppuration.  The differential diagnosis does include minor salivary gland tumor, but that seems quite unlikely.   Original Report Authenticated By: Paulina Fusi, M.D.     INCISION AND DRAINAGE Performed by: Gwyneth Sprout Consent: Verbal consent obtained. Risks and benefits: risks, benefits and alternatives were discussed Type: abscess  Body area: upper palate  Anesthesia: local infiltration and cetacane spray  Local anesthetic:bupivacaine .5% with epi Anesthetic total: 1 ml  Complexity: simple Drainage: purulent  Drainage amount: 2mL  Packing material: none Patient tolerance: Patient tolerated the procedure well with no immediate complications.     1. Hard palate abscess       MDM   Patient with evidence of an abscess in the upper palate of her mouth back close to the tonsillar pillar. There does not appear to be any dental involvement. There is no new uvula or tonsillar involvement. She is able to swallow normally and has no breathing issues. This is a very unusual place for an abscess and she states she's had a bulge there for a very long period of time. Prior to I&D will CT to ensure no other structures present.   2:59 PM CT with abscess only.  Area drained as above.   I personally performed the services described in this documentation, which was scribed in my presence.  The recorded information has been reviewed and considered.    Gwyneth Sprout, MD 01/14/12 1246  Gwyneth Sprout, MD 01/14/12 1301  Gwyneth Sprout, MD 01/14/12 1459  Gwyneth Sprout, MD 01/14/12 1501  Gwyneth Sprout, MD 01/14/12 1520

## 2012-03-29 ENCOUNTER — Ambulatory Visit (INDEPENDENT_AMBULATORY_CARE_PROVIDER_SITE_OTHER): Payer: Medicaid Other | Admitting: General Practice

## 2012-03-29 VITALS — BP 122/84 | HR 77 | Temp 97.5°F | Ht 66.0 in | Wt 176.9 lb

## 2012-03-29 DIAGNOSIS — Z3049 Encounter for surveillance of other contraceptives: Secondary | ICD-10-CM

## 2012-03-29 MED ORDER — MEDROXYPROGESTERONE ACETATE 150 MG/ML IM SUSP
150.0000 mg | Freq: Once | INTRAMUSCULAR | Status: AC
  Administered 2012-03-29: 150 mg via INTRAMUSCULAR

## 2012-06-14 ENCOUNTER — Ambulatory Visit: Payer: Medicaid Other

## 2012-06-18 ENCOUNTER — Ambulatory Visit: Payer: Medicaid Other

## 2012-10-15 ENCOUNTER — Ambulatory Visit: Payer: Medicaid Other

## 2014-01-06 ENCOUNTER — Encounter (HOSPITAL_COMMUNITY): Payer: Self-pay | Admitting: Physical Medicine and Rehabilitation

## 2014-05-16 ENCOUNTER — Emergency Department (HOSPITAL_COMMUNITY)
Admission: EM | Admit: 2014-05-16 | Discharge: 2014-05-16 | Disposition: A | Discharge status: Home / Self Care | Payer: Medicaid Other | Source: Home / Self Care | Attending: Emergency Medicine | Admitting: Emergency Medicine

## 2014-05-16 ENCOUNTER — Encounter (HOSPITAL_COMMUNITY): Payer: Self-pay | Admitting: Emergency Medicine

## 2014-05-16 ENCOUNTER — Other Ambulatory Visit (HOSPITAL_COMMUNITY)
Admission: RE | Admit: 2014-05-16 | Discharge: 2014-05-16 | Disposition: A | Discharge status: Home / Self Care | Payer: Medicaid Other | Source: Ambulatory Visit | Attending: Emergency Medicine | Admitting: Emergency Medicine

## 2014-05-16 DIAGNOSIS — B373 Candidiasis of vulva and vagina: Secondary | ICD-10-CM | POA: Diagnosis not present

## 2014-05-16 DIAGNOSIS — N760 Acute vaginitis: Secondary | ICD-10-CM | POA: Diagnosis present

## 2014-05-16 DIAGNOSIS — N390 Urinary tract infection, site not specified: Secondary | ICD-10-CM

## 2014-05-16 DIAGNOSIS — B3731 Acute candidiasis of vulva and vagina: Secondary | ICD-10-CM

## 2014-05-16 LAB — CERVICOVAGINAL ANCILLARY ONLY
WET PREP (BD AFFIRM): NEGATIVE
Wet Prep (BD Affirm): NEGATIVE
Wet Prep (BD Affirm): POSITIVE — AB

## 2014-05-16 LAB — POCT PREGNANCY, URINE
Preg Test, Ur: NEGATIVE
Preg Test, Ur: NEGATIVE

## 2014-05-16 LAB — POCT URINALYSIS DIP (DEVICE)
BILIRUBIN URINE: NEGATIVE
GLUCOSE, UA: NEGATIVE mg/dL
KETONES UR: NEGATIVE mg/dL
Nitrite: NEGATIVE
PH: 7 (ref 5.0–8.0)
Protein, ur: 100 mg/dL — AB
SPECIFIC GRAVITY, URINE: 1.025 (ref 1.005–1.030)
Urobilinogen, UA: 0.2 mg/dL (ref 0.0–1.0)

## 2014-05-16 MED ORDER — CEPHALEXIN 500 MG PO CAPS: 500.0000 mg | ORAL_CAPSULE | Freq: Three times a day (TID) | ORAL | Status: DC

## 2014-05-16 MED ORDER — FLUCONAZOLE 150 MG PO TABS: 150.0000 mg | ORAL_TABLET | Freq: Once | ORAL | Status: DC

## 2014-05-16 NOTE — Discharge Instructions (Signed)
You have a urinary tract infection. Take Keflex one pill 3 times a day for 3 days.  You also have a yeast infection. Take the Diflucan after you finish the Keflex.  Follow-up as needed.

## 2014-05-16 NOTE — ED Notes (Signed)
C/o urinary sx onset 2 days Sx include urinary frequency/urgency, dysuria, hematuria, vag d/c and chills Denies abd pain Alert, no signs of acute distress.

## 2014-05-16 NOTE — ED Provider Notes (Signed)
CSN: [id number]     Arrival date & time 05/16/14  1033 History   First MD Initiated Contact with Patient 05/16/14 1124     Chief Complaint  Patient presents with  . Urinary Tract Infection   (Consider location/radiation/quality/duration/timing/severity/associated sxs/prior Treatment) HPI She is a 34 year old woman here for evaluation of dysuria. She states for the last 2 days she has had dysuria, hematuria, urgency, frequency. She denies any fevers or chills. No flank pain or abdominal pain. She also reports some light vaginal discharge associated with some vaginal itching. She had a UTI when she was a baby.  History reviewed. No pertinent past medical history. History reviewed. No pertinent past surgical history. Family History  Problem Relation Age of Onset  . Hyperlipidemia Mother    History  Substance Use Topics  . Smoking status: Current Some Day Smoker    Types: Cigarettes  . Smokeless tobacco: Never Used  . Alcohol Use: Yes     Comment: occ   OB History    Gravida Para Term Preterm AB TAB SAB Ectopic Multiple Living   Review of Systems  Constitutional: Negative for fever and chills.  Gastrointestinal: Negative for nausea, vomiting and abdominal pain.  Genitourinary: Positive for dysuria, urgency, frequency, hematuria and vaginal discharge. Negative for flank pain.    Allergies  Review of patient's allergies indicates no known allergies.  Home Medications   Prior to Admission medications   Medication Sig Start Date End Date Taking? Authorizing Provider  cephALEXin (KEFLEX) 500 MG capsule Take 1 capsule (500 mg total) by mouth 3 (three) times daily. 05/16/14   Charm Rings, MD  clindamycin (CLEOCIN) 150 MG capsule Take 1 capsule (150 mg total) by mouth every 6 (six) hours. 01/14/12   Gwyneth Sprout, MD  fluconazole (DIFLUCAN) 150 MG tablet Take 1 tablet (150 mg total) by mouth once. Repeat in 3 days if symptoms persist 05/16/14   Charm Rings, MD   HYDROcodone-acetaminophen (VICODIN) 5-500 MG per tablet Take 1 tablet by mouth every 6 (six) hours as needed for pain. 01/14/12   Gwyneth Sprout, MD  medroxyPROGESTERone (DEPO-PROVERA) 150 MG/ML injection Inject 150 mg into the muscle every 3 (three) months.    Historical Provider, MD   BP 131/89 mmHg  Pulse 62  Temp(Src) 98 F (36.7 C) (Oral)  Resp 14  SpO2 100%  LMP 05/09/2014 Physical Exam  Constitutional: She is oriented to person, place, and time. She appears well-developed and well-nourished. No distress.  Cardiovascular: Normal rate.   Pulmonary/Chest: Effort normal.  Abdominal: Soft.  No CVA tenderness  Genitourinary: There is no rash on the right labia. There is no rash on the left labia. No foreign body around the vagina. No signs of injury around the vagina. Vaginal discharge (thick white) found.  Neurological: She is alert and oriented to person, place, and time.    ED Course  Procedures (including critical care time) Labs Review Labs Reviewed  POCT URINALYSIS DIP (DEVICE) - Abnormal; Notable for the following:    Hgb urine dipstick MODERATE (*)    Protein, ur 100 (*)    Leukocytes, UA SMALL (*)    All other components within normal limits  URINE CULTURE  POCT PREGNANCY, URINE  POCT PREGNANCY, URINE  CERVICOVAGINAL ANCILLARY ONLY    Imaging Review No results found.   MDM   1. UTI (lower urinary tract infection)   2. Vaginal candida  Wet prep sent. Urine culture sent. Will treat UTI with Keflex for 3 days. Diflucan to take after Keflex. Follow-up as needed.    Charm RingsErin J Ramsey Guadamuz, MD 05/16/14 [phone]

## 2014-05-19 LAB — URINE CULTURE

## 2014-05-20 ENCOUNTER — Telehealth (HOSPITAL_COMMUNITY): Payer: Self-pay | Admitting: *Deleted

## 2014-05-20 MED ORDER — METRONIDAZOLE 500 MG PO TABS: 500.0000 mg | ORAL_TABLET | Freq: Two times a day (BID) | ORAL | Status: DC

## 2014-05-20 MED ORDER — FLUCONAZOLE 150 MG PO TABS
150.0000 mg | ORAL_TABLET | Freq: Once | ORAL | Status: DC
Start: 2014-05-20 — End: 2014-12-16

## 2014-05-20 NOTE — ED Notes (Addendum)
Urine culture: >100,000 colonies E. Coli, Affirm: Candida and Trich neg., Gardnerella pos.  Pt. adequately treated with Keflex. 3/14 message sent to Dr. Piedad ClimesHonig.  3/15 Dr. Piedad ClimesHonig e-prescribed Flagyl and Diflucan to pt.'s pharmacy.  I called and left a message to call. Call 1. 05/20/2014 I called 3/16, 3/17, 3/18, 3/23 and left messages.  I called Pt. verified x 2 and given results. Pt. told she was adequately treated for UTI with Keflex and needs Flagyl for bacterial vaginosis.   Pt. instructed to no alcohol while taking this medication.  Pt. told to take Diflucan if she develops a yeast infection.  Pt. told where to pick up her Rx. Vassie MoselleYork, Jamonta Goerner M 05/29/2014

## 2014-12-16 ENCOUNTER — Emergency Department (HOSPITAL_COMMUNITY)
Admission: EM | Admit: 2014-12-16 | Discharge: 2014-12-16 | Disposition: A | Discharge status: Home / Self Care | Payer: Medicaid Other | Attending: Emergency Medicine | Admitting: Emergency Medicine

## 2014-12-16 ENCOUNTER — Other Ambulatory Visit: Payer: Self-pay | Admitting: Otolaryngology

## 2014-12-16 ENCOUNTER — Encounter (HOSPITAL_COMMUNITY): Payer: Self-pay | Admitting: Emergency Medicine

## 2014-12-16 DIAGNOSIS — Z3202 Encounter for pregnancy test, result negative: Secondary | ICD-10-CM | POA: Diagnosis not present

## 2014-12-16 DIAGNOSIS — Z720 Tobacco use: Secondary | ICD-10-CM | POA: Insufficient documentation

## 2014-12-16 DIAGNOSIS — R220 Localized swelling, mass and lump, head: Secondary | ICD-10-CM | POA: Diagnosis present

## 2014-12-16 DIAGNOSIS — K122 Cellulitis and abscess of mouth: Secondary | ICD-10-CM | POA: Diagnosis not present

## 2014-12-16 LAB — POC URINE PREG, ED: Preg Test, Ur: NEGATIVE

## 2014-12-16 NOTE — ED Notes (Signed)
Pt has an abscess to the soft tissue oral mucosa near posterior mouth. States this has happened before and they lanced it in the ER. Moderate sized abscess, states it's not occluding airway but makes it difficult and painful to swallow.

## 2014-12-16 NOTE — Discharge Instructions (Signed)

## 2014-12-16 NOTE — ED Notes (Signed)
Patient states she is on her period now but states she is having clots and cramping and wants to be checked to see if she is having a miscarriage

## 2014-12-17 NOTE — ED Provider Notes (Signed)
CSN: [id number]     Arrival date & time 12/16/14  1106 History   First MD Initiated Contact with Patient 12/16/14 1240     Chief Complaint  Patient presents with  . Oral Swelling  . Abscess     (Consider location/radiation/quality/duration/timing/severity/associated sxs/prior Treatment) Patient is a 34 y.o. female presenting with abscess. The history is provided by the patient.  Abscess Location:  Mouth Mouth abscess location: R sided, soft palate. Abscess quality: draining, fluctuance, painful, redness and weeping   Abscess quality: no warmth   Red streaking: no   Duration:  2 days Progression:  Worsening Pain details:    Quality:  Dull and sharp   Severity:  Moderate   Duration:  2 days   Timing:  Constant   Progression:  Worsening Relieved by:  Nothing Worsened by:  Nothing tried Associated symptoms: no fever and no vomiting     History reviewed. No pertinent past medical history. History reviewed. No pertinent past surgical history. Family History  Problem Relation Age of Onset  . Hyperlipidemia Mother    Social History  Substance Use Topics  . Smoking status: Current Some Day Smoker    Types: Cigarettes  . Smokeless tobacco: Never Used  . Alcohol Use: Yes     Comment: occ   OB History    Gravida Para Term Preterm AB TAB SAB Ectopic Multiple Living   Review of Systems  Constitutional: Negative for fever and chills.  HENT: Negative for mouth sores and sore throat.   Respiratory: Negative for cough and shortness of breath.   Gastrointestinal: Negative for vomiting and abdominal pain.  All other systems reviewed and are negative.     Allergies  Review of patient's allergies indicates no known allergies.  Home Medications   Prior to Admission medications   Not on File   BP 111/57 mmHg  Pulse 82  Temp(Src) 98.3 F (36.8 C) (Oral)  Resp 18  SpO2 100%  LMP 12/16/2014 Physical Exam  Constitutional: She is oriented to  person, place, and time. She appears well-developed and well-nourished. No distress.  HENT:  Head: Normocephalic and atraumatic.  Mouth/Throat: Oropharynx is clear and moist.    Eyes: EOM are normal. Pupils are equal, round, and reactive to light.  Neck: Normal range of motion. Neck supple.  Cardiovascular: Normal rate and regular rhythm.  Exam reveals no friction rub.   No murmur heard. Pulmonary/Chest: Effort normal and breath sounds normal. No respiratory distress. She has no wheezes. She has no rales.  Abdominal: Soft. She exhibits no distension. There is no tenderness. There is no rebound.  Musculoskeletal: Normal range of motion. She exhibits no edema.  Neurological: She is alert and oriented to person, place, and time.  Skin: She is not diaphoretic.  Nursing note and vitals reviewed.   ED Course  Procedures (including critical care time) Labs Review Labs Reviewed  POC URINE PREG, ED    Imaging Review No results found. I have personally reviewed and evaluated these images and lab results as part of my medical decision-making.   EKG Interpretation None      MDM   Final diagnoses:  Oral abscess    34 year old female here with intra-oral abscess. Hx of same. No fever, no difficulty swallowing or breathing. No abnormal phonation. Here has abscess on R side of soft palate, airway otherwise stable, no stridor, no respiratory distress. Hx of same  abscess in 2013, was drained by ENT. I spoke with Dr. Emeline DarlingGore with ENT who will see in his office this afternoon. Patient discharged to go straight over there.   Elwin MochaBlair Efrata Brunner, MD 12/17/14 [phone]

## 2015-01-07 ENCOUNTER — Other Ambulatory Visit: Payer: Self-pay | Admitting: Otolaryngology

## 2015-01-10 ENCOUNTER — Encounter (HOSPITAL_COMMUNITY): Payer: Self-pay | Admitting: *Deleted

## 2015-01-10 ENCOUNTER — Emergency Department (HOSPITAL_COMMUNITY)
Admission: EM | Admit: 2015-01-10 | Discharge: 2015-01-11 | Disposition: A | Discharge status: Home / Self Care | Payer: Medicaid Other | Attending: Emergency Medicine | Admitting: Emergency Medicine

## 2015-01-10 DIAGNOSIS — R070 Pain in throat: Secondary | ICD-10-CM | POA: Diagnosis not present

## 2015-01-10 DIAGNOSIS — R1310 Dysphagia, unspecified: Secondary | ICD-10-CM | POA: Diagnosis not present

## 2015-01-10 DIAGNOSIS — Z720 Tobacco use: Secondary | ICD-10-CM | POA: Diagnosis not present

## 2015-01-10 DIAGNOSIS — Z9889 Other specified postprocedural states: Secondary | ICD-10-CM | POA: Diagnosis not present

## 2015-01-10 DIAGNOSIS — Z9089 Acquired absence of other organs: Secondary | ICD-10-CM

## 2015-01-10 DIAGNOSIS — G8918 Other acute postprocedural pain: Secondary | ICD-10-CM | POA: Diagnosis present

## 2015-01-10 DIAGNOSIS — H9201 Otalgia, right ear: Secondary | ICD-10-CM | POA: Insufficient documentation

## 2015-01-10 MED ORDER — MORPHINE SULFATE (PF) 4 MG/ML IV SOLN
4.0000 mg | Freq: Once | INTRAVENOUS | Status: AC
  Administered 2015-01-10: 4 mg via INTRAVENOUS
  Filled 2015-01-10: qty 1

## 2015-01-10 MED ORDER — ONDANSETRON HCL 4 MG/2ML IJ SOLN
4.0000 mg | Freq: Once | INTRAMUSCULAR | Status: AC
  Administered 2015-01-10: 4 mg via INTRAVENOUS
  Filled 2015-01-10: qty 2

## 2015-01-10 NOTE — ED Provider Notes (Signed)
CSN: [id number]     Arrival date & time 01/10/15  2221 History  By signing my name below, I, Doreatha Martin, attest that this documentation has been prepared under the direction and in the presence of Geoffery Lyons, MD. Electronically Signed: Doreatha Martin, ED Scribe. 01/10/2015. 11:35 PM.    Chief Complaint  Patient presents with  . Post-op Problem   Patient is a 34 y.o. female presenting with general illness. The history is provided by the patient. No language interpreter was used.  Illness Location:  Throat Quality:  Pain Severity:  Moderate Onset quality:  Gradual Duration:  3 days Timing:  Constant Progression:  Worsening Chronicity:  New Context:  S/p tonsillectomy Ineffective treatments:  Pain medication Associated symptoms: ear pain and sore throat   Associated symptoms: no fever   Associated symptoms comment:  No bleeding from incision Ear pain:    Location:  Right   HPI Comments: [name] is a 34 y.o. female s/p tonsillectomy 3 days ago at [facility] who presents to the Emergency Department complaining of increased throat pain tonight with associated difficulty swallowing secondary to pain, right-sided otalgia. Pt has been tolerating fluids. She states no relief with pain medication. Family states that the liquid pain medication comes out of her nose. Pt is able to hear from her right ear without difficulty. She denies fever, bleeding from the incision.    History reviewed. No pertinent past medical history. Past Surgical History  Procedure Laterality Date  . Tonsillectomy     Family History  Problem Relation Age of Onset  . Hyperlipidemia Mother    Social History  Substance Use Topics  . Smoking status: Current Some Day Smoker    Types: Cigarettes  . Smokeless tobacco: Never Used  . Alcohol Use: Yes     Comment: occ   OB History    Gravida Para Term Preterm AB TAB SAB Ectopic Multiple Living   Review of Systems  Constitutional:  Negative for fever.  HENT: Positive for ear pain, sore throat and trouble swallowing.   All other systems reviewed and are negative.  Allergies  Review of patient's allergies indicates no known allergies.  Home Medications   Prior to Admission medications   Medication Sig Start Date End Date Taking? Authorizing Provider  amoxicillin (AMOXIL) 400 MG/5ML suspension take 6 milliliters by mouth twice a day for 7 days START AFTER SURGERY 01/07/15  Yes Historical Provider, MD  HYDROcodone-acetaminophen (HYCET) 7.5-325 mg/15 ml solution take 15 milliliters by mouth every 4 to 6 hours if needed for pain 01/07/15  Yes Historical Provider, MD  promethazine (PHENERGAN) 6.25 MG/5ML syrup take 2 teaspoonfuls by mouth 3 TO 4 TIMES DAILY AS NEEDED FOR POST OP NAUSEA 01/07/15  Yes Historical Provider, MD   BP 136/88 mmHg  Pulse 80  Temp(Src) 98.5 F (36.9 C) (Oral)  Resp 18  Ht  (1.676 m)  Wt 188 lb (85.276 kg)  BMI 30.36 kg/m2  SpO2 100%  LMP 12/16/2014 Physical Exam  Constitutional: She is oriented to person, place, and time. She appears well-developed and well-nourished.  HENT:  Head: Normocephalic.  Posterior oropharynx is noted to have eschar from prior tonsillectomy. There is no bleeding or significant redness. There is no stridor.   Eyes: EOM are normal.  Neck: Normal range of motion.  Pulmonary/Chest: Effort normal.  Abdominal: She exhibits no distension.  Musculoskeletal: Normal range of motion.  Lymphadenopathy:    She has no cervical adenopathy.  Neurological: She is alert and oriented to person, place, and time.  Psychiatric: She has a normal mood and affect.  Nursing note and vitals reviewed.  ED Course  Procedures (including critical care time) DIAGNOSTIC STUDIES: Oxygen Saturation is 100% on RA, normal by my interpretation.    COORDINATION OF CARE: 11:31 PM Discussed treatment plan with pt at bedside and pt agreed to plan.   MDM   Final diagnoses:  None     Feeling better after fluids, pain meds.  Labs normal.  Incision appears to be healing appropriately.  I personally performed the services described in this documentation, which was scribed in my presence. The recorded information has been reviewed and is accurate.       Geoffery Lyonsouglas Kimesha Claxton, MD 01/11/15 [phone]

## 2015-01-10 NOTE — ED Notes (Signed)
Patient presents stating she had her tonsils out on Wednesday.  States her throat os real sore and her right ear hurts.  States she has been taking her pain med but it has not been helping

## 2015-01-11 LAB — CBC WITH DIFFERENTIAL/PLATELET
BASOS ABS: 0 10*3/uL (ref 0.0–0.1)
Basophils Relative: 0 %
EOS PCT: 2 %
Eosinophils Absolute: 0.1 10*3/uL (ref 0.0–0.7)
HEMATOCRIT: 35.9 % — AB (ref 36.0–46.0)
Hemoglobin: 11.6 g/dL — ABNORMAL LOW (ref 12.0–15.0)
LYMPHS ABS: 1.3 10*3/uL (ref 0.7–4.0)
LYMPHS PCT: 20 %
MCH: 26 pg (ref 26.0–34.0)
MCHC: 32.3 g/dL (ref 30.0–36.0)
MCV: 80.3 fL (ref 78.0–100.0)
Monocytes Absolute: 0.4 10*3/uL (ref 0.1–1.0)
Monocytes Relative: 6 %
NEUTROS ABS: 4.8 10*3/uL (ref 1.7–7.7)
Neutrophils Relative %: 72 %
PLATELETS: 363 10*3/uL (ref 150–400)
RBC: 4.47 MIL/uL (ref 3.87–5.11)
RDW: 16.1 % — ABNORMAL HIGH (ref 11.5–15.5)
WBC: 6.5 10*3/uL (ref 4.0–10.5)

## 2015-01-11 LAB — BASIC METABOLIC PANEL
ANION GAP: 8 (ref 5–15)
BUN: 6 mg/dL (ref 6–20)
CHLORIDE: 103 mmol/L (ref 101–111)
CO2: 26 mmol/L (ref 22–32)
Calcium: 9 mg/dL (ref 8.9–10.3)
Creatinine, Ser: 0.76 mg/dL (ref 0.44–1.00)
GFR calc Af Amer: >60 mL/min (ref >60)
GFR calc non Af Amer: >60 mL/min (ref >60)
GLUCOSE: 84 mg/dL (ref 65–99)
POTASSIUM: 3.5 mmol/L (ref 3.5–5.1)
Sodium: 137 mmol/L (ref 135–145)

## 2015-01-11 MED ORDER — HYDROCODONE-ACETAMINOPHEN 7.5-325 MG/15ML PO SOLN: ORAL | Status: DC

## 2015-01-11 NOTE — Discharge Instructions (Signed)
Continue your medications as before.  Return to the ER for bleeding, difficulty breathing, or other new and concerning symptoms.

## 2015-03-08 HISTORY — PX: TONSILLECTOMY: SUR1361

## 2015-06-02 ENCOUNTER — Emergency Department (INDEPENDENT_AMBULATORY_CARE_PROVIDER_SITE_OTHER)
Admission: EM | Admit: 2015-06-02 | Discharge: 2015-06-02 | Disposition: A | Discharge status: Home / Self Care | Payer: Medicaid Other | Source: Home / Self Care | Attending: Family Medicine | Admitting: Family Medicine

## 2015-06-02 DIAGNOSIS — S46811A Strain of other muscles, fascia and tendons at shoulder and upper arm level, right arm, initial encounter: Secondary | ICD-10-CM

## 2015-06-02 MED ORDER — CYCLOBENZAPRINE HCL 10 MG PO TABS: 10.0000 mg | ORAL_TABLET | Freq: Three times a day (TID) | ORAL | Status: DC

## 2015-06-02 MED ORDER — HYDROCODONE-ACETAMINOPHEN 7.5-325 MG/15ML PO SOLN: 15.0000 mL | Freq: Four times a day (QID) | ORAL | Status: DC | PRN

## 2015-06-02 NOTE — ED Notes (Signed)
[facility] a [facility] of a stiff and sore neck x 3 days. The patient denied any known injury.

## 2015-06-02 NOTE — Discharge Instructions (Signed)
Muscle Strain °A muscle strain (pulled muscle) happens when a muscle is stretched beyond normal length. It happens when a sudden, violent force stretches your muscle too far. Usually, a few of the fibers in your muscle are torn. Muscle strain is common in athletes. Recovery usually takes 1-2 weeks. Complete healing takes 5-6 weeks.  °HOME CARE  °· Follow the PRICE method of treatment to help your injury get better. Do this the first 2-3 days after the injury: °¨ Protect. Protect the muscle to keep it from getting injured again. °¨ Rest. Limit your activity and rest the injured body part. °¨ Ice. Put ice in a plastic bag. Place a towel between your skin and the bag. Then, apply the ice and leave it on from 15-20 minutes each hour. After the third day, switch to moist heat packs. °¨ Compression. Use a splint or elastic bandage on the injured area for comfort. Do not put it on too tightly. °¨ Elevate. Keep the injured body part above the level of your heart. °· Only take medicine as told by your doctor. °· Warm up before doing exercise to prevent future muscle strains. °GET HELP IF:  °· You have more pain or puffiness (swelling) in the injured area. °· You feel numbness, tingling, or notice a loss of strength in the injured area. °MAKE SURE YOU:  °· Understand these instructions. °· Will watch your condition. °· Will get help right away if you are not doing well or get worse. °  °This information is not intended to replace advice given to you by your health care provider. Make sure you discuss any questions you have with your health care provider. °  °Document Released: 12/01/2007 Document Revised: 12/12/2012 Document Reviewed: 09/20/2012 °[employer]vier Interactive Patient Education ©2016 [employer] ° °

## 2015-06-03 NOTE — ED Provider Notes (Signed)
CSN: [id number]     Arrival date & time 06/02/15  1934 History   First MD Initiated Contact with Patient 06/02/15 2120     Chief Complaint  Patient presents with  . Torticollis   (Consider location/radiation/quality/duration/timing/severity/associated sxs/prior Treatment) HPI History obtained from patient:   LOCATION:right neck SEVERITY:4 DURATION:2 days CONTEXT:sudden onset after helping mother move furniture QUALITY: MODIFYING FACTORS:ibuprofen ASSOCIATED SYMPTOMS:headache TIMING:constant OCCUPATION:  No past medical history on file. Past Surgical History  Procedure Laterality Date  . Tonsillectomy     Family History  Problem Relation Age of Onset  . Hyperlipidemia Mother    Social History  Substance Use Topics  . Smoking status: Current Some Day Smoker    Types: Cigarettes  . Smokeless tobacco: Never Used  . Alcohol Use: Yes     Comment: occ   OB History    Gravida Para Term Preterm AB TAB SAB Ectopic Multiple Living   11 3 2 1 8 8    3      Review of Systems Neck pain Allergies  Review of patient's allergies indicates no known allergies.  Home Medications   Prior to Admission medications   Medication Sig Start Date End Date Taking? Authorizing Provider  amoxicillin (AMOXIL) 400 MG/5ML suspension take 6 milliliters by mouth twice a day for 7 days START AFTER SURGERY 01/07/15   Historical Provider, MD  cyclobenzaprine (FLEXERIL) 10 MG tablet Take 1 tablet (10 mg total) by mouth 3 (three) times daily. 06/02/15   Tharon AquasFrank C Clay Solum, PA  HYDROcodone-acetaminophen (HYCET) 7.5-325 mg/15 ml solution Take 15cc every six hours as needed for pain. 01/11/15   Geoffery Lyonsouglas Delo, MD  HYDROcodone-acetaminophen (HYCET) 7.5-325 mg/15 ml solution Take 15 mLs by mouth 4 (four) times daily as needed for moderate pain. 06/02/15 06/01/16  Tharon AquasFrank C Ariyel Jeangilles, PA  promethazine (PHENERGAN) 6.25 MG/5ML syrup take 2 teaspoonfuls by mouth 3 TO 4 TIMES DAILY AS NEEDED FOR POST OP NAUSEA 01/07/15    Historical Provider, MD   Meds Ordered and Administered this Visit  Medications - No data to display  BP 114/73 mmHg  Pulse 70  Temp(Src) 99.4 F (37.4 C) (Oral)  Resp 12  SpO2 100% No data found.   Physical Exam NURSES NOTES AND VITAL SIGNS REVIEWED. CONSTITUTIONAL: Well developed, well nourished, no acute distress HEENT: normocephalic, atraumatic, right and left TM's are normal EYES: Conjunctiva normal NECK: Right trapezius, spasm palpable, no midline cervical spine tenderness.  no adenopathy PULMONARY:No respiratory distress, normal effort, Lungs: CTAb/l, no wheezes, or increased work of breathing MUSCULOSKELETAL: Normal ROM of all extremities,  SKIN: warm and dry without rash PSYCHIATRIC: Mood and affect, behavior are normal  ED Course  Procedures (including critical care time)  Labs Review Labs Reviewed - No data to display  Imaging Review No results found.   Visual Acuity Review  Right Eye Distance:   Left Eye Distance:   Bilateral Distance:    Right Eye Near:   Left Eye Near:    Bilateral Near:     RX flexeril, norco    MDM   1. Trapezius strain, right, initial encounter     Patient is reassured that there are no issues that require transfer to higher level of care at this time or additional tests. Patient is advised to continue home symptomatic treatment. Patient is advised that if there are new or worsening symptoms to attend the emergency department, contact primary care provider, or return to UC. Instructions of care provided discharged home in stable condition. Return  to work/school note provided.   THIS NOTE WAS GENERATED USING A VOICE RECOGNITION SOFTWARE PROGRAM. ALL REASONABLE EFFORTS  WERE MADE TO PROOFREAD THIS DOCUMENT FOR ACCURACY.  I have verbally reviewed the discharge instructions with the patient. A printed AVS was given to the patient.  All questions were answered prior to discharge.      Tharon Aquas, PA 06/03/15 1021

## 2015-07-29 ENCOUNTER — Ambulatory Visit: Payer: Medicaid Other | Admitting: Obstetrics and Gynecology

## 2016-02-25 ENCOUNTER — Emergency Department (HOSPITAL_COMMUNITY)
Admission: EM | Admit: 2016-02-25 | Discharge: 2016-02-25 | Disposition: A | Discharge status: Home / Self Care | Payer: Medicaid Other | Attending: Emergency Medicine | Admitting: Emergency Medicine

## 2016-02-25 ENCOUNTER — Encounter (HOSPITAL_COMMUNITY): Payer: Self-pay | Admitting: Emergency Medicine

## 2016-02-25 DIAGNOSIS — N3000 Acute cystitis without hematuria: Secondary | ICD-10-CM | POA: Insufficient documentation

## 2016-02-25 DIAGNOSIS — F17210 Nicotine dependence, cigarettes, uncomplicated: Secondary | ICD-10-CM | POA: Insufficient documentation

## 2016-02-25 DIAGNOSIS — Z79899 Other long term (current) drug therapy: Secondary | ICD-10-CM | POA: Insufficient documentation

## 2016-02-25 LAB — URINALYSIS, ROUTINE W REFLEX MICROSCOPIC
Bilirubin Urine: NEGATIVE
Glucose, UA: NEGATIVE mg/dL
Ketones, ur: NEGATIVE mg/dL
NITRITE: NEGATIVE
Protein, ur: NEGATIVE mg/dL
Specific Gravity, Urine: 1.01 (ref 1.005–1.030)
pH: 6 (ref 5.0–8.0)

## 2016-02-25 LAB — POC URINE PREG, ED: PREG TEST UR: NEGATIVE

## 2016-02-25 MED ORDER — CEPHALEXIN 500 MG PO CAPS: 500.0000 mg | ORAL_CAPSULE | Freq: Two times a day (BID) | ORAL | 0 refills | Status: AC

## 2016-02-25 NOTE — ED Provider Notes (Signed)
[facility]-EMERGENCY DEPT Provider Note   CSN: [id number] Arrival date & time: 02/25/16  1550  By signing my name below, I, [name], attest that this documentation has been prepared under the direction and in the presence of Kathy Digiulio C. Lesta Limbert, PA-C. Electronically Signed: Placido SouLogan Navarro, ED Scribe. 02/25/16. 4:41 PM.   History   Chief Complaint Chief Complaint  Patient presents with  . back pain/dysuria    HPI HPI Comments: [name] is a 35 y.o. female who presents to the Emergency Department complaining of constant, moderate, right lower back pain x 3 days. She reports associated urinary urgency and dysuria. States the symptoms have been associated with UTI in the past. Pt has no known drug allergies. She denies fevers, chills, abdominal pain, nausea, vomiting, trauma, or other associated symptoms at this time.      The history is provided by the patient and medical records. No language interpreter was used.    History reviewed. No pertinent past medical history.  Patient Active Problem List   Diagnosis Date Noted  . Mild dysplasia of cervix (CIN I) 01/12/2012  . ASCUS with positive high risk HPV 04/21/2011  . Contraception management 03/24/2011    Past Surgical History:  Procedure Laterality Date  . TONSILLECTOMY      OB History    Gravida Para Term Preterm AB Living   11 3 2 1 8 3    SAB TAB Ectopic Multiple Live Births     8             Home Medications    Prior to Admission medications   Medication Sig Start Date End Date Taking? Authorizing Provider  amoxicillin (AMOXIL) 400 MG/5ML suspension take 6 milliliters by mouth twice a day for 7 days START AFTER SURGERY 01/07/15   Historical Provider, MD  cephALEXin (KEFLEX) 500 MG capsule Take 1 capsule (500 mg total) by mouth 2 (two) times daily. 02/25/16 03/01/16  [name] C Kathy Paolo, PA-C  cyclobenzaprine (FLEXERIL) 10 MG tablet Take 1 tablet (10 mg total) by mouth 3 (three) times daily. 06/02/15   Kathy AquasFrank C Patrick, PA   HYDROcodone-acetaminophen (HYCET) 7.5-325 mg/15 ml solution Take 15cc every six hours as needed for pain. 01/11/15   Kathy Lyonsouglas Delo, MD  HYDROcodone-acetaminophen (HYCET) 7.5-325 mg/15 ml solution Take 15 mLs by mouth 4 (four) times daily as needed for moderate pain. 06/02/15 06/01/16  Kathy AquasFrank C Patrick, PA  promethazine (PHENERGAN) 6.25 MG/5ML syrup take 2 teaspoonfuls by mouth 3 TO 4 TIMES DAILY AS NEEDED FOR POST OP NAUSEA 01/07/15   Historical Provider, MD    Family History Family History  Problem Relation Age of Onset  . Hyperlipidemia Mother     Social History Social History  Substance Use Topics  . Smoking status: Current Some Day Smoker    Types: Cigarettes  . Smokeless tobacco: Never Used  . Alcohol use Yes     Comment: occ     Allergies   Patient has no known allergies.   Review of Systems Review of Systems  Constitutional: Negative for chills and fever.  Gastrointestinal: Negative for abdominal pain, nausea and vomiting.  Genitourinary: Positive for dysuria and urgency. Negative for difficulty urinating.  Musculoskeletal: Positive for back pain.  All other systems reviewed and are negative.  Physical Exam Updated Vital Signs BP 110/69 (BP Location: Left Arm)   Pulse 85   Resp 16   SpO2 100%   Physical Exam  Constitutional: She appears well-developed and well-nourished. No distress.  HENT:  Head: Normocephalic and atraumatic.  Eyes: Conjunctivae are normal.  Neck: Neck supple.  Cardiovascular: Normal rate, regular rhythm, normal heart sounds and intact distal pulses.   Pulmonary/Chest: Effort normal and breath sounds normal. No respiratory distress.  Abdominal: Soft. There is no tenderness. There is no guarding.  Musculoskeletal: She exhibits no edema.  Tenderness to the right lower back. Questionable right CVA tenderness.   Lymphadenopathy:    She has no cervical adenopathy.  Neurological: She is alert.  Skin: Skin is warm and dry. She is not diaphoretic.    Psychiatric: She has a normal mood and affect. Her behavior is normal.  Nursing note and vitals reviewed.  ED Treatments / Results  Labs (all labs ordered are listed, but only abnormal results are displayed) Labs Reviewed  URINALYSIS, ROUTINE W REFLEX MICROSCOPIC - Abnormal; Notable for the following:       Result Value   APPearance HAZY (*)    Hgb urine dipstick SMALL (*)    Leukocytes, UA TRACE (*)    Bacteria, UA RARE (*)    Squamous Epithelial / LPF 6-30 (*)    All other components within normal limits  URINE CULTURE  POC URINE PREG, ED   EKG  EKG Interpretation None       Radiology No results found.  Procedures Procedures  COORDINATION OF CARE: 4:41 PM Discussed next steps with pt. Pt verbalized understanding and is agreeable with the plan.    Medications Ordered in ED Medications - No data to display   Initial Impression / Assessment and Plan / ED Course  I have reviewed the triage vital signs and the nursing notes.  Pertinent labs & imaging results that were available during my care of the patient were reviewed by me and considered in my medical decision making (see chart for details).  Clinical Course    Patient presents with urinary complaints. Questionable early UTI on UA. Trial of antibiotics. Return precautions discussed.   I personally performed the services described in this documentation, which was scribed in my presence. The recorded information has been reviewed and is accurate. Final Clinical Impressions(s) / ED Diagnoses   Final diagnoses:  Acute cystitis without hematuria    New Prescriptions Discharge Medication List as of 02/25/2016  4:58 PM    START taking these medications   Details  cephALEXin (KEFLEX) 500 MG capsule Take 1 capsule (500 mg total) by mouth 2 (two) times daily., Starting Thu 02/25/2016, Until Tue 03/01/2016, Print         Kathy PancoastShawn C Avarose Mervine, PA-C 02/26/16 1049    Kathy LovelessScott Goldston, MD 02/27/16 [phone]

## 2016-02-25 NOTE — Discharge Instructions (Signed)
There were the beginning signs of an UTI on the urine. Please take all of your antibiotics until finished!   You may develop abdominal discomfort or diarrhea from the antibiotic.  You may help offset this with probiotics which you can buy or get in yogurt. Do not eat or take the probiotics until 2 hours after your antibiotic.  Return to the ED should symptoms worsen.

## 2016-02-25 NOTE — ED Triage Notes (Signed)
Per pt, states lower back pain with urinary hesitancy she thinks she has a UTI

## 2016-02-27 LAB — URINE CULTURE: CULTURE: NO GROWTH

## 2016-05-03 ENCOUNTER — Inpatient Hospital Stay (HOSPITAL_COMMUNITY)
Admission: AD | Admit: 2016-05-03 | Discharge: 2016-05-03 | Disposition: A | Discharge status: Home / Self Care | Payer: BLUE CROSS/BLUE SHIELD | Source: Ambulatory Visit | Attending: Family Medicine | Admitting: Family Medicine

## 2016-05-03 ENCOUNTER — Encounter (HOSPITAL_COMMUNITY): Payer: Self-pay | Admitting: *Deleted

## 2016-05-03 DIAGNOSIS — F17210 Nicotine dependence, cigarettes, uncomplicated: Secondary | ICD-10-CM | POA: Insufficient documentation

## 2016-05-03 DIAGNOSIS — L293 Anogenital pruritus, unspecified: Secondary | ICD-10-CM | POA: Diagnosis present

## 2016-05-03 DIAGNOSIS — N760 Acute vaginitis: Secondary | ICD-10-CM | POA: Diagnosis not present

## 2016-05-03 DIAGNOSIS — B9689 Other specified bacterial agents as the cause of diseases classified elsewhere: Secondary | ICD-10-CM | POA: Diagnosis not present

## 2016-05-03 LAB — URINALYSIS, ROUTINE W REFLEX MICROSCOPIC
Bilirubin Urine: NEGATIVE
GLUCOSE, UA: NEGATIVE mg/dL
HGB URINE DIPSTICK: NEGATIVE
Ketones, ur: NEGATIVE mg/dL
NITRITE: NEGATIVE
PH: 6 (ref 5.0–8.0)
Protein, ur: NEGATIVE mg/dL
Specific Gravity, Urine: 1.018 (ref 1.005–1.030)

## 2016-05-03 LAB — WET PREP, GENITAL
SPERM: NONE SEEN
TRICH WET PREP: NONE SEEN
Yeast Wet Prep HPF POC: NONE SEEN

## 2016-05-03 LAB — POCT PREGNANCY, URINE: Preg Test, Ur: POSITIVE — AB

## 2016-05-03 MED ORDER — METRONIDAZOLE 500 MG PO TABS: 500.0000 mg | ORAL_TABLET | Freq: Three times a day (TID) | ORAL | 0 refills | Status: AC

## 2016-05-03 NOTE — MAU Note (Deleted)
Received a text , anonymous notification , possible STD exposure. Thinks she might have BV, has a d/c with a slight odor

## 2016-05-03 NOTE — Discharge Instructions (Signed)

## 2016-05-03 NOTE — MAU Note (Signed)
NOT IN LOBBY 

## 2016-05-03 NOTE — MAU Provider Note (Signed)
  History   [name] is a 36 year old [phone] here with complaints of vaginal itching and clumpy for two days. She has not tried anything for this; she denies abdominal pain, bleeding, fever or pain with urination.   CSN: [id number]  Arrival date and time: 05/03/16 1834   None     Chief Complaint  Patient presents with  . Vaginal Discharge  . Possible Pregnancy   HPI  OB History    Gravida Para Term Preterm AB Living   11 3 2 1 8 3    SAB TAB Ectopic Multiple Live Births     8            No past medical history on file.  Past Surgical History:  Procedure Laterality Date  . TONSILLECTOMY      Family History  Problem Relation Age of Onset  . Hyperlipidemia Mother     Social History  Substance Use Topics  . Smoking status: Current Some Day Smoker    Types: Cigarettes  . Smokeless tobacco: Never Used  . Alcohol use Yes     Comment: occ    Allergies: No Known Allergies  No prescriptions prior to admission.    Review of Systems  All other systems reviewed and are negative.  Physical Exam   Blood pressure 124/79, pulse 83, temperature 99.1 F (37.3 C), temperature source Oral, resp. rate 16, weight 180 lb 4 oz (81.8 kg), last menstrual period 04/05/2016.  Physical Exam  Constitutional: She is oriented to person, place, and time. She appears well-developed.  HENT:  Head: Normocephalic.  Neck: Normal range of motion.  Respiratory: Effort normal.  Musculoskeletal: Normal range of motion.  Neurological: She is alert and oriented to person, place, and time.  Skin: Skin is warm and dry.  Psychiatric: She has a normal mood and affect.    MAU Course  Procedures  MDM -UPT: positive -UA:  -wet prep: positive for clue  Assessment and Plan   1. Bacterial vaginosis    2. Discharge patient home with rx for Flagyl.  3. Patient to follow up with her ob-gyn to start prenatal care.   Luna KitchensKathryn Boyce Keltner CNM  Charlesetta GaribaldiKathryn Lorraine Tanise Russman 05/03/2016,  9:25 PM

## 2016-05-03 NOTE — MAU Note (Signed)
Thinks she has a yeast infection, has been itching down there and has a smell.  Started yesterday.  Hasn't had her period.  Breasts are tender and has been having heartburn like crazy

## 2016-05-04 LAB — GC/CHLAMYDIA PROBE AMP (~~LOC~~) NOT AT ARMC
Chlamydia: NEGATIVE
NEISSERIA GONORRHEA: NEGATIVE

## 2016-06-23 ENCOUNTER — Ambulatory Visit: Payer: BLUE CROSS/BLUE SHIELD | Admitting: Obstetrics & Gynecology

## 2016-10-26 ENCOUNTER — Encounter: Payer: Self-pay | Admitting: Obstetrics

## 2016-10-26 ENCOUNTER — Ambulatory Visit (INDEPENDENT_AMBULATORY_CARE_PROVIDER_SITE_OTHER): Payer: BLUE CROSS/BLUE SHIELD

## 2016-10-26 DIAGNOSIS — Z3201 Encounter for pregnancy test, result positive: Secondary | ICD-10-CM | POA: Diagnosis not present

## 2016-10-26 DIAGNOSIS — N926 Irregular menstruation, unspecified: Secondary | ICD-10-CM

## 2016-10-26 LAB — POCT URINE PREGNANCY: Preg Test, Ur: POSITIVE — AB

## 2016-10-26 NOTE — Progress Notes (Signed)
[name] presents today for UPT. She has no unusual complaints. LMP: 09/27/16    OBJECTIVE: Appears well, in no apparent distress.  OB History    Gravida Para Term Preterm AB Living   11 3 2 1 8 3    SAB TAB Ectopic Multiple Live Births     8           Home UPT Result: positive per pt  In-Office UPT result: positive  I have reviewed the patient's medical, obstetrical, social, and family histories, and medications.   ASSESSMENT: Positive pregnancy test  PLAN Prenatal care to be completed at: [facility]

## 2016-12-15 ENCOUNTER — Encounter: Payer: BLUE CROSS/BLUE SHIELD | Admitting: Certified Nurse Midwife

## 2016-12-30 ENCOUNTER — Ambulatory Visit (INDEPENDENT_AMBULATORY_CARE_PROVIDER_SITE_OTHER): Payer: BLUE CROSS/BLUE SHIELD | Admitting: Certified Nurse Midwife

## 2016-12-30 ENCOUNTER — Encounter: Payer: Self-pay | Admitting: Certified Nurse Midwife

## 2016-12-30 ENCOUNTER — Other Ambulatory Visit (HOSPITAL_COMMUNITY)
Admission: RE | Admit: 2016-12-30 | Discharge: 2016-12-30 | Disposition: A | Discharge status: Home / Self Care | Payer: BLUE CROSS/BLUE SHIELD | Source: Ambulatory Visit | Attending: Certified Nurse Midwife | Admitting: Certified Nurse Midwife

## 2016-12-30 VITALS — BP 121/79 | HR 96 | Wt 203.8 lb

## 2016-12-30 DIAGNOSIS — Z6791 Unspecified blood type, Rh negative: Secondary | ICD-10-CM | POA: Diagnosis not present

## 2016-12-30 DIAGNOSIS — O26899 Other specified pregnancy related conditions, unspecified trimester: Secondary | ICD-10-CM

## 2016-12-30 DIAGNOSIS — O09299 Supervision of pregnancy with other poor reproductive or obstetric history, unspecified trimester: Secondary | ICD-10-CM

## 2016-12-30 DIAGNOSIS — O99612 Diseases of the digestive system complicating pregnancy, second trimester: Secondary | ICD-10-CM | POA: Diagnosis not present

## 2016-12-30 DIAGNOSIS — O26891 Other specified pregnancy related conditions, first trimester: Secondary | ICD-10-CM | POA: Insufficient documentation

## 2016-12-30 DIAGNOSIS — Z113 Encounter for screening for infections with a predominantly sexual mode of transmission: Secondary | ICD-10-CM | POA: Diagnosis not present

## 2016-12-30 DIAGNOSIS — Z8751 Personal history of pre-term labor: Secondary | ICD-10-CM | POA: Insufficient documentation

## 2016-12-30 DIAGNOSIS — O26892 Other specified pregnancy related conditions, second trimester: Secondary | ICD-10-CM

## 2016-12-30 DIAGNOSIS — Z124 Encounter for screening for malignant neoplasm of cervix: Secondary | ICD-10-CM

## 2016-12-30 DIAGNOSIS — Z3A13 13 weeks gestation of pregnancy: Secondary | ICD-10-CM | POA: Insufficient documentation

## 2016-12-30 DIAGNOSIS — Z331 Pregnant state, incidental: Secondary | ICD-10-CM

## 2016-12-30 DIAGNOSIS — Z3482 Encounter for supervision of other normal pregnancy, second trimester: Secondary | ICD-10-CM

## 2016-12-30 DIAGNOSIS — O09899 Supervision of other high risk pregnancies, unspecified trimester: Secondary | ICD-10-CM | POA: Diagnosis not present

## 2016-12-30 DIAGNOSIS — O0990 Supervision of high risk pregnancy, unspecified, unspecified trimester: Secondary | ICD-10-CM | POA: Diagnosis present

## 2016-12-30 DIAGNOSIS — R519 Headache, unspecified: Secondary | ICD-10-CM

## 2016-12-30 DIAGNOSIS — O99611 Diseases of the digestive system complicating pregnancy, first trimester: Secondary | ICD-10-CM | POA: Diagnosis not present

## 2016-12-30 DIAGNOSIS — K219 Gastro-esophageal reflux disease without esophagitis: Secondary | ICD-10-CM

## 2016-12-30 DIAGNOSIS — O09892 Supervision of other high risk pregnancies, second trimester: Secondary | ICD-10-CM | POA: Diagnosis not present

## 2016-12-30 DIAGNOSIS — O0992 Supervision of high risk pregnancy, unspecified, second trimester: Secondary | ICD-10-CM

## 2016-12-30 DIAGNOSIS — R51 Headache: Secondary | ICD-10-CM | POA: Insufficient documentation

## 2016-12-30 DIAGNOSIS — O0991 Supervision of high risk pregnancy, unspecified, first trimester: Secondary | ICD-10-CM | POA: Diagnosis not present

## 2016-12-30 DIAGNOSIS — O09292 Supervision of pregnancy with other poor reproductive or obstetric history, second trimester: Secondary | ICD-10-CM

## 2016-12-30 DIAGNOSIS — Z1389 Encounter for screening for other disorder: Secondary | ICD-10-CM | POA: Diagnosis not present

## 2016-12-30 DIAGNOSIS — Z3480 Encounter for supervision of other normal pregnancy, unspecified trimester: Secondary | ICD-10-CM

## 2016-12-30 MED ORDER — OMEPRAZOLE 20 MG PO CPDR: 20.0000 mg | DELAYED_RELEASE_CAPSULE | Freq: Two times a day (BID) | ORAL | 5 refills | Status: DC

## 2016-12-30 MED ORDER — PRENATE PIXIE 10-0.6-0.4-200 MG PO CAPS: 1.0000 | ORAL_CAPSULE | Freq: Every day | ORAL | 12 refills | Status: DC

## 2016-12-30 MED ORDER — ASPIRIN 81 MG PO CHEW: 81.0000 mg | CHEWABLE_TABLET | Freq: Every day | ORAL | 12 refills | Status: DC

## 2016-12-30 MED ORDER — BUTALBITAL-APAP-CAFFEINE 50-325-40 MG PO TABS: 1.0000 | ORAL_TABLET | Freq: Four times a day (QID) | ORAL | 2 refills | Status: DC | PRN

## 2016-12-30 NOTE — Progress Notes (Addendum)
Subjective:    Kathy Piananika R Navarro is being seen today for her first obstetrical visit.  This is not a planned pregnancy. She is at 5664w3d gestation. Her obstetrical history is significant for obesity, smoker and multiple abortions, hx of preterm delivery at 34 weeks, is stopping smoking. Relationship with FOB: significant other, not living together. Patient does intend to breast feed. Pregnancy history fully reviewed.  [place] [employer] [place] [place]  Has tonsil/adnoidectomy in 2017.  Desires BTL this pregnancy.   The information documented in the HPI was reviewed and verified.  Menstrual History: OB History    Gravida Para Term Preterm AB Living   14 3 2 1 10 3    SAB TAB Ectopic Multiple Live Births   1 9     3        Patient's last menstrual period was 09/27/2016.    Past Medical History:  Diagnosis Date  . Anemia     Past Surgical History:  Procedure Laterality Date  . TONSILLECTOMY  2017     (Not in a hospital admission) No Known Allergies  Social History  Substance Use Topics  . Smoking status: Former Smoker    Types: Cigarettes    Quit date: 2018  . Smokeless tobacco: Never Used  . Alcohol use No     Comment: occ    Family History  Problem Relation Age of Onset  . Hyperlipidemia Mother      Review of Systems Constitutional: negative for weight loss Gastrointestinal: + for nausea & vomiting Genitourinary:negative for genital lesions and vaginal discharge and dysuria Musculoskeletal:negative for back pain Behavioral/Psych: negative for abusive relationship, depression, illegal drug usage and tobacco use    Objective:    BP 121/79   Pulse 96   Wt 203 lb 12.8 oz (92.4 kg)   LMP 09/27/2016   BMI 32.89 kg/m  General Appearance:    Alert, cooperative, no distress, appears stated age  Head:    Normocephalic, without obvious abnormality, atraumatic  Eyes:    PERRL, conjunctiva/corneas clear, EOM's intact, fundi    benign, both eyes  Ears:    Normal TM's  and external ear canals, both ears  Nose:   Nares normal, septum midline, mucosa normal, no drainage    or sinus tenderness  Throat:   Lips, mucosa, and tongue normal; teeth and gums normal  Neck:   Supple, symmetrical, trachea midline, no adenopathy;    thyroid:  no enlargement/tenderness/nodules; no carotid   bruit or JVD  Back:     Symmetric, no curvature, ROM normal, no CVA tenderness  Lungs:     Clear to auscultation bilaterally, respirations unlabored  Chest Wall:    No tenderness or deformity   Heart:    Regular rate and rhythm, S1 and S2 normal, no murmur, rub   or gallop  Breast Exam:    No tenderness, masses, or nipple abnormality  Abdomen:     Soft, non-tender, bowel sounds active all four quadrants,    no masses, no organomegaly  Genitalia:    Normal female without lesion, discharge or tenderness  Extremities:   Extremities normal, atraumatic, no cyanosis or edema  Pulses:   2+ and symmetric all extremities  Skin:   Skin color, texture, turgor normal, no rashes or lesions  Lymph nodes:   Cervical, supraclavicular, and axillary nodes normal  Neurologic:   CNII-XII intact, normal strength, sensation and reflexes    throughout      Cervix: long, thick, closed and posterior.  FHR: 170's by doppler.  Size c/w dates.    Lab Review Urine pregnancy test Labs reviewed yes Radiologic studies reviewed no  Assessment & Plan    Pregnancy at [redacted]w[redacted]d weeks    1. Supervision of other normal pregnancy, antepartum     Hx of preterm delivery  2. Supervision of high risk pregnancy, antepartum    - Cytology - PAP - Cervicovaginal ancillary only - Culture, OB Urine - Hemoglobinopathy evaluation - HgB A1c - Vitamin D (25 hydroxy) - Obstetric Panel, Including HIV - MaterniT21 PLUS Core+SCA - Cystic Fibrosis Mutation 97 - Varicella zoster antibody, IgG - [place] MFM OB DETAIL +14 WK; Future - Prenat-FeAsp-Meth-FA-DHA w/o A (PRENATE PIXIE) 10-0.6-0.4-200 MG CAPS; Take 1 tablet by mouth  daily.  Dispense: 30 capsule; Refill: 12  3. History of preterm delivery     - [place] MFM OB DETAIL +14 WK; Future  4. Pregnancy headache in second trimester     - butalbital-acetaminophen-caffeine (FIORICET, ESGIC) 50-325-40 MG tablet; Take 1-2 tablets by mouth every 6 (six) hours as needed.  Dispense: 45 tablet; Refill: 2  5. Gastroesophageal reflux during pregnancy, antepartum, second trimester     - omeprazole (PRILOSEC) 20 MG capsule; Take 1 capsule (20 mg total) by mouth 2 (two) times daily before a meal.  Dispense: 60 capsule; Refill: 5  6. Rh negative state in antepartum period     Rhogam at 28 weeks.      Prenatal vitamins.  Counseling provided regarding continued use of seat belts, cessation of alcohol consumption, smoking or use of illicit drugs; infection precautions i.e., influenza/TDAP immunizations, toxoplasmosis,CMV, parvovirus, listeria and varicella; workplace safety, exercise during pregnancy; routine dental care, safe medications, sexual activity, hot tubs, saunas, pools, travel, caffeine use, fish and methlymercury, potential toxins, hair treatments, varicose veins Weight gain recommendations per IOM guidelines reviewed: underweight/BMI< 18.5--> gain 28 - 40 lbs; normal weight/BMI 18.5 - 24.9--> gain 25 - 35 lbs; overweight/BMI 25 - 29.9--> gain 15 - 25 lbs; obese/BMI >30->gain  11 - 20 lbs Problem list reviewed and updated. FIRST/CF mutation testing/NIPT/QUAD SCREEN/fragile X/Ashkenazi Jewish population testing/Spinal muscular atrophy discussed: ordered. Role of ultrasound in pregnancy discussed; fetal survey: ordered. Amniocentesis discussed: not indicated.  Meds ordered this encounter  Medications  . butalbital-acetaminophen-caffeine (FIORICET, ESGIC) 50-325-40 MG tablet    Sig: Take 1-2 tablets by mouth every 6 (six) hours as needed.    Dispense:  45 tablet    Refill:  2  . omeprazole (PRILOSEC) 20 MG capsule    Sig: Take 1 capsule (20 mg total) by mouth 2 (two)  times daily before a meal.    Dispense:  60 capsule    Refill:  5  . Prenat-FeAsp-Meth-FA-DHA w/o A (PRENATE PIXIE) 10-0.6-0.4-200 MG CAPS    Sig: Take 1 tablet by mouth daily.    Dispense:  30 capsule    Refill:  12    Please process coupon: Rx BIN: [id number], RxPCN: [id number], RxGRP: [id number], RxID: [id number]  SUF: 01   Orders Placed This Encounter  Procedures  . Culture, OB Urine  . [place] MFM OB DETAIL +14 WK    Standing Status:   Future    Standing Expiration Date:   03/01/2018    Order Specific Question:   Reason for Exam (SYMPTOM  OR DIAGNOSIS REQUIRED)    Answer:   fetal anatomy scan, obese, hx of preterm delivery    Order Specific Question:   Preferred Imaging Location?    Answer:   [facility]  .  Hemoglobinopathy evaluation  . HgB A1c  . Vitamin D (25 hydroxy)  . Obstetric Panel, Including HIV  . MaterniT21 PLUS Core+SCA    Order Specific Question:   Is patient insulin dependent?    Answer:   No    Order Specific Question:   Weight (lbs)    Answer:   203    Order Specific Question:   Gestational Age (GA), weeks    Answer:   13.3    Order Specific Question:   Date on which patient was at this GA    Answer:   12/30/2016    Order Specific Question:   GA Calculation Method    Answer:   LMP    Order Specific Question:   GA Date    Answer:   07/04/2017    Order Specific Question:   Number of fetuses    Answer:   1    Order Specific Question:   Additional information?    Answer:   FOB is 36 years old    Order Specific Question:   Donor egg?    Answer:   N    Order Specific Question:   Age of egg donor?    Answer:   35  . Cystic Fibrosis Mutation 97  . Varicella zoster antibody, IgG    Follow up in 4 weeks.  Start 17-P at 17 weeks.  50% of 45 min visit spent on counseling and coordination of care.

## 2016-12-30 NOTE — Progress Notes (Signed)
Patient is in the office for initial ob visit complains of heartburn.

## 2017-01-01 LAB — URINE CULTURE, OB REFLEX

## 2017-01-01 LAB — CULTURE, OB URINE

## 2017-01-03 LAB — CERVICOVAGINAL ANCILLARY ONLY
BACTERIAL VAGINITIS: POSITIVE — AB
CANDIDA VAGINITIS: POSITIVE — AB
CHLAMYDIA, DNA PROBE: NEGATIVE
NEISSERIA GONORRHEA: NEGATIVE
TRICH (WINDOWPATH): NEGATIVE

## 2017-01-03 LAB — CYTOLOGY - PAP
Diagnosis: NEGATIVE
HPV (WINDOPATH): NOT DETECTED

## 2017-01-04 ENCOUNTER — Other Ambulatory Visit: Payer: Self-pay | Admitting: Certified Nurse Midwife

## 2017-01-04 DIAGNOSIS — O0990 Supervision of high risk pregnancy, unspecified, unspecified trimester: Secondary | ICD-10-CM

## 2017-01-04 DIAGNOSIS — B3731 Acute candidiasis of vulva and vagina: Secondary | ICD-10-CM

## 2017-01-04 DIAGNOSIS — B9689 Other specified bacterial agents as the cause of diseases classified elsewhere: Secondary | ICD-10-CM

## 2017-01-04 DIAGNOSIS — R7989 Other specified abnormal findings of blood chemistry: Secondary | ICD-10-CM | POA: Insufficient documentation

## 2017-01-04 DIAGNOSIS — N760 Acute vaginitis: Secondary | ICD-10-CM

## 2017-01-04 DIAGNOSIS — B373 Candidiasis of vulva and vagina: Secondary | ICD-10-CM

## 2017-01-04 LAB — OBSTETRIC PANEL, INCLUDING HIV
Antibody Screen: NEGATIVE
BASOS ABS: 0 10*3/uL (ref 0.0–0.2)
Basos: 0 %
EOS (ABSOLUTE): 0.1 10*3/uL (ref 0.0–0.4)
EOS: 2 %
HEMATOCRIT: 37.1 % (ref 34.0–46.6)
HEMOGLOBIN: 11.7 g/dL (ref 11.1–15.9)
HEP B S AG: NEGATIVE
HIV SCREEN 4TH GENERATION: NONREACTIVE
IMMATURE GRANULOCYTES: 0 %
Immature Grans (Abs): 0 10*3/uL (ref 0.0–0.1)
LYMPHS: 16 %
Lymphocytes Absolute: 1.2 10*3/uL (ref 0.7–3.1)
MCH: 24.5 pg — ABNORMAL LOW (ref 26.6–33.0)
MCHC: 31.5 g/dL (ref 31.5–35.7)
MCV: 78 fL — ABNORMAL LOW (ref 79–97)
MONOCYTES: 5 %
MONOS ABS: 0.4 10*3/uL (ref 0.1–0.9)
Neutrophils Absolute: 5.6 10*3/uL (ref 1.4–7.0)
Neutrophils: 77 %
Platelets: 378 10*3/uL (ref 150–379)
RBC: 4.77 x10E6/uL (ref 3.77–5.28)
RDW: 19.4 % — ABNORMAL HIGH (ref 12.3–15.4)
RPR: NONREACTIVE
Rh Factor: NEGATIVE
Rubella Antibodies, IGG: 6.56 index (ref >0.99)
WBC: 7.3 10*3/uL (ref 3.4–10.8)

## 2017-01-04 LAB — MATERNIT21 PLUS CORE+SCA
CHROMOSOME 18: NEGATIVE
CHROMOSOME 21: NEGATIVE
Chromosome 13: NEGATIVE
Y CHROMOSOME: DETECTED

## 2017-01-04 LAB — VITAMIN D 25 HYDROXY (VIT D DEFICIENCY, FRACTURES): Vit D, 25-Hydroxy: 11.6 ng/mL — ABNORMAL LOW (ref 30.0–100.0)

## 2017-01-04 LAB — VARICELLA ZOSTER ANTIBODY, IGG: Varicella zoster IgG: 1881 index (ref >165)

## 2017-01-04 MED ORDER — VITAMIN D (ERGOCALCIFEROL) 1.25 MG (50000 UNIT) PO CAPS: 50000.0000 [IU] | ORAL_CAPSULE | ORAL | 2 refills | Status: DC

## 2017-01-04 MED ORDER — METRONIDAZOLE 500 MG PO TABS: 500.0000 mg | ORAL_TABLET | Freq: Two times a day (BID) | ORAL | 0 refills | Status: DC

## 2017-01-04 MED ORDER — FLUCONAZOLE 150 MG PO TABS: 150.0000 mg | ORAL_TABLET | Freq: Once | ORAL | 0 refills | Status: AC

## 2017-01-04 MED ORDER — TERCONAZOLE 0.8 % VA CREA: 1.0000 | TOPICAL_CREAM | Freq: Every day | VAGINAL | 0 refills | Status: DC

## 2017-01-05 ENCOUNTER — Other Ambulatory Visit: Payer: Self-pay | Admitting: Certified Nurse Midwife

## 2017-01-05 ENCOUNTER — Telehealth: Payer: Self-pay | Admitting: Pediatrics

## 2017-01-05 DIAGNOSIS — O0990 Supervision of high risk pregnancy, unspecified, unspecified trimester: Secondary | ICD-10-CM

## 2017-01-05 NOTE — Telephone Encounter (Signed)
Pt states rx not at pharmacy on [address].  I called pt back and [facility] [address] She voiced understanding and states she will pick up.

## 2017-01-16 ENCOUNTER — Telehealth: Payer: Self-pay

## 2017-01-16 NOTE — Telephone Encounter (Signed)
Pt called and left a vm. Returned call, no answer, left message for pt to return call back to office

## 2017-01-24 ENCOUNTER — Encounter: Payer: BLUE CROSS/BLUE SHIELD | Admitting: Certified Nurse Midwife

## 2017-02-02 ENCOUNTER — Encounter (HOSPITAL_COMMUNITY): Payer: Self-pay | Admitting: Certified Nurse Midwife

## 2017-02-07 ENCOUNTER — Encounter (HOSPITAL_COMMUNITY): Payer: Self-pay

## 2017-02-07 ENCOUNTER — Other Ambulatory Visit: Payer: Self-pay | Admitting: Certified Nurse Midwife

## 2017-02-07 ENCOUNTER — Ambulatory Visit (HOSPITAL_COMMUNITY)
Admission: RE | Admit: 2017-02-07 | Discharge: 2017-02-07 | Disposition: A | Discharge status: Home / Self Care | Payer: BLUE CROSS/BLUE SHIELD | Source: Ambulatory Visit | Attending: Certified Nurse Midwife | Admitting: Certified Nurse Midwife

## 2017-02-07 DIAGNOSIS — O0990 Supervision of high risk pregnancy, unspecified, unspecified trimester: Secondary | ICD-10-CM

## 2017-02-07 DIAGNOSIS — Z3689 Encounter for other specified antenatal screening: Secondary | ICD-10-CM

## 2017-02-07 DIAGNOSIS — Z8751 Personal history of pre-term labor: Secondary | ICD-10-CM

## 2017-02-07 DIAGNOSIS — O99212 Obesity complicating pregnancy, second trimester: Secondary | ICD-10-CM | POA: Insufficient documentation

## 2017-02-07 DIAGNOSIS — Z3A19 19 weeks gestation of pregnancy: Secondary | ICD-10-CM | POA: Diagnosis not present

## 2017-02-07 DIAGNOSIS — Z6833 Body mass index (BMI) 33.0-33.9, adult: Secondary | ICD-10-CM | POA: Insufficient documentation

## 2017-02-07 DIAGNOSIS — O09219 Supervision of pregnancy with history of pre-term labor, unspecified trimester: Secondary | ICD-10-CM | POA: Insufficient documentation

## 2017-02-07 DIAGNOSIS — O99332 Smoking (tobacco) complicating pregnancy, second trimester: Secondary | ICD-10-CM | POA: Insufficient documentation

## 2017-02-08 ENCOUNTER — Other Ambulatory Visit (HOSPITAL_COMMUNITY): Payer: Self-pay | Admitting: *Deleted

## 2017-02-08 DIAGNOSIS — O359XX0 Maternal care for (suspected) fetal abnormality and damage, unspecified, not applicable or unspecified: Secondary | ICD-10-CM

## 2017-02-10 ENCOUNTER — Other Ambulatory Visit (HOSPITAL_COMMUNITY): Payer: BLUE CROSS/BLUE SHIELD

## 2017-02-10 ENCOUNTER — Other Ambulatory Visit: Payer: Self-pay | Admitting: Certified Nurse Midwife

## 2017-02-10 DIAGNOSIS — O0990 Supervision of high risk pregnancy, unspecified, unspecified trimester: Secondary | ICD-10-CM

## 2017-03-02 ENCOUNTER — Telehealth: Payer: Self-pay | Admitting: Pediatrics

## 2017-03-02 DIAGNOSIS — B3731 Acute candidiasis of vulva and vagina: Secondary | ICD-10-CM

## 2017-03-02 DIAGNOSIS — O0990 Supervision of high risk pregnancy, unspecified, unspecified trimester: Secondary | ICD-10-CM

## 2017-03-02 DIAGNOSIS — B373 Candidiasis of vulva and vagina: Secondary | ICD-10-CM

## 2017-03-02 MED ORDER — TERCONAZOLE 0.8 % VA CREA: 1.0000 | TOPICAL_CREAM | Freq: Every day | VAGINAL | 0 refills | Status: DC

## 2017-03-02 MED ORDER — PRENATE PIXIE 10-0.6-0.4-200 MG PO CAPS: 1.0000 | ORAL_CAPSULE | Freq: Every day | ORAL | 12 refills | Status: DC

## 2017-03-02 NOTE — Telephone Encounter (Signed)
Pt called nurseline reporting itchy-white-thick vaginal discharge.  Pt reports she has yeast infection.  Pt also reports she would like rx for prenate pixie vitamins now that she has [facility].  PNV rx sent. Terazol rx sent per standing order.

## 2017-03-10 ENCOUNTER — Telehealth: Payer: Self-pay | Admitting: Pediatrics

## 2017-03-10 MED ORDER — PRENATAL PLUS 27-1 MG PO TABS: 1.0000 | ORAL_TABLET | Freq: Every day | ORAL | 12 refills | Status: DC

## 2017-03-10 NOTE — Telephone Encounter (Signed)
Prenate Pixie not covered on [employer]. Therapy changed

## 2017-03-13 ENCOUNTER — Encounter: Payer: BLUE CROSS/BLUE SHIELD | Admitting: Certified Nurse Midwife

## 2017-03-14 ENCOUNTER — Other Ambulatory Visit (HOSPITAL_COMMUNITY): Payer: Self-pay | Admitting: Maternal and Fetal Medicine

## 2017-03-14 ENCOUNTER — Encounter (HOSPITAL_COMMUNITY): Payer: Self-pay

## 2017-03-14 ENCOUNTER — Ambulatory Visit (HOSPITAL_COMMUNITY)
Admission: RE | Admit: 2017-03-14 | Discharge: 2017-03-14 | Disposition: A | Discharge status: Home / Self Care | Payer: BLUE CROSS/BLUE SHIELD | Source: Ambulatory Visit | Attending: Certified Nurse Midwife | Admitting: Certified Nurse Midwife

## 2017-03-14 DIAGNOSIS — O09219 Supervision of pregnancy with history of pre-term labor, unspecified trimester: Secondary | ICD-10-CM

## 2017-03-14 DIAGNOSIS — O09899 Supervision of other high risk pregnancies, unspecified trimester: Secondary | ICD-10-CM

## 2017-03-14 DIAGNOSIS — Z362 Encounter for other antenatal screening follow-up: Secondary | ICD-10-CM | POA: Diagnosis not present

## 2017-03-14 DIAGNOSIS — O09292 Supervision of pregnancy with other poor reproductive or obstetric history, second trimester: Secondary | ICD-10-CM | POA: Diagnosis not present

## 2017-03-14 DIAGNOSIS — O99212 Obesity complicating pregnancy, second trimester: Secondary | ICD-10-CM | POA: Insufficient documentation

## 2017-03-14 DIAGNOSIS — O359XX0 Maternal care for (suspected) fetal abnormality and damage, unspecified, not applicable or unspecified: Secondary | ICD-10-CM

## 2017-03-14 DIAGNOSIS — O09212 Supervision of pregnancy with history of pre-term labor, second trimester: Secondary | ICD-10-CM | POA: Diagnosis not present

## 2017-03-14 DIAGNOSIS — Z3A24 24 weeks gestation of pregnancy: Secondary | ICD-10-CM | POA: Insufficient documentation

## 2017-03-14 DIAGNOSIS — O99332 Smoking (tobacco) complicating pregnancy, second trimester: Secondary | ICD-10-CM | POA: Diagnosis not present

## 2017-03-15 ENCOUNTER — Other Ambulatory Visit (HOSPITAL_COMMUNITY)
Admission: RE | Admit: 2017-03-15 | Discharge: 2017-03-15 | Disposition: A | Discharge status: Home / Self Care | Payer: BLUE CROSS/BLUE SHIELD | Source: Ambulatory Visit | Attending: Certified Nurse Midwife | Admitting: Certified Nurse Midwife

## 2017-03-15 ENCOUNTER — Other Ambulatory Visit (HOSPITAL_COMMUNITY): Payer: Self-pay | Admitting: *Deleted

## 2017-03-15 ENCOUNTER — Ambulatory Visit (INDEPENDENT_AMBULATORY_CARE_PROVIDER_SITE_OTHER): Payer: BLUE CROSS/BLUE SHIELD | Admitting: Certified Nurse Midwife

## 2017-03-15 VITALS — BP 129/80 | HR 96 | Wt 218.4 lb

## 2017-03-15 DIAGNOSIS — Q249 Congenital malformation of heart, unspecified: Secondary | ICD-10-CM

## 2017-03-15 DIAGNOSIS — O0992 Supervision of high risk pregnancy, unspecified, second trimester: Secondary | ICD-10-CM

## 2017-03-15 DIAGNOSIS — Z3A24 24 weeks gestation of pregnancy: Secondary | ICD-10-CM | POA: Insufficient documentation

## 2017-03-15 DIAGNOSIS — Z6791 Unspecified blood type, Rh negative: Secondary | ICD-10-CM | POA: Diagnosis not present

## 2017-03-15 DIAGNOSIS — O09892 Supervision of other high risk pregnancies, second trimester: Secondary | ICD-10-CM

## 2017-03-15 DIAGNOSIS — R7989 Other specified abnormal findings of blood chemistry: Secondary | ICD-10-CM | POA: Diagnosis not present

## 2017-03-15 DIAGNOSIS — Z8751 Personal history of pre-term labor: Secondary | ICD-10-CM

## 2017-03-15 DIAGNOSIS — O0990 Supervision of high risk pregnancy, unspecified, unspecified trimester: Secondary | ICD-10-CM | POA: Diagnosis present

## 2017-03-15 DIAGNOSIS — O26899 Other specified pregnancy related conditions, unspecified trimester: Secondary | ICD-10-CM

## 2017-03-15 NOTE — Progress Notes (Signed)
Patient reports good fetal movement and states that she has been having pressure and irregular contractions when she is on the job, in relation to her work load. Pt reports vaginal irritation and pt declined 17-p injections

## 2017-03-16 LAB — CERVICOVAGINAL ANCILLARY ONLY
BACTERIAL VAGINITIS: NEGATIVE
CANDIDA VAGINITIS: POSITIVE — AB
Chlamydia: NEGATIVE
Neisseria Gonorrhea: NEGATIVE
TRICH (WINDOWPATH): NEGATIVE

## 2017-03-18 LAB — HEMOGLOBIN A1C
ESTIMATED AVERAGE GLUCOSE: 120 mg/dL
Hgb A1c MFr Bld: 5.8 % — ABNORMAL HIGH (ref 4.8–5.6)

## 2017-03-18 LAB — HEMOGLOBINOPATHY EVALUATION
HEMOGLOBIN A2 QUANTITATION: 2.2 % (ref 1.8–3.2)
HEMOGLOBIN F QUANTITATION: 0 % (ref 0.0–2.0)
HGB A: 97.8 % (ref 96.4–98.8)
HGB C: 0 %
HGB S: 0 %
HGB VARIANT: 0 %

## 2017-03-20 ENCOUNTER — Telehealth: Payer: Self-pay

## 2017-03-20 ENCOUNTER — Other Ambulatory Visit: Payer: Self-pay | Admitting: Certified Nurse Midwife

## 2017-03-20 DIAGNOSIS — R7309 Other abnormal glucose: Secondary | ICD-10-CM | POA: Insufficient documentation

## 2017-03-20 DIAGNOSIS — O0990 Supervision of high risk pregnancy, unspecified, unspecified trimester: Secondary | ICD-10-CM

## 2017-03-20 DIAGNOSIS — B3731 Acute candidiasis of vulva and vagina: Secondary | ICD-10-CM

## 2017-03-20 DIAGNOSIS — B373 Candidiasis of vulva and vagina: Secondary | ICD-10-CM

## 2017-03-20 MED ORDER — TERCONAZOLE 0.8 % VA CREA: 1.0000 | TOPICAL_CREAM | Freq: Every day | VAGINAL | 0 refills | Status: DC

## 2017-03-20 MED ORDER — FLUCONAZOLE 150 MG PO TABS: 150.0000 mg | ORAL_TABLET | Freq: Once | ORAL | 0 refills | Status: AC

## 2017-03-20 NOTE — Telephone Encounter (Signed)
-----   Message from Roe Coombsachelle A Denney, CNM sent at 03/20/2017  1:14 PM EST ----- Please let her know that she has a yeast infection.  Diflucan and Terconazole vaginal cream was sent to the pharmacy for her to use.  Thank you.

## 2017-03-20 NOTE — Progress Notes (Signed)
   PRENATAL VISIT NOTE  Subjective:  [name] is a 37 y.o. [id number] at 1186w1d being seen today for ongoing prenatal care.  She is currently monitored for the following issues for this high-risk pregnancy and has Supervision of high risk pregnancy, antepartum; History of preterm delivery; Rh negative state in antepartum period; and Low vitamin D level on their problem list.  Patient reports no complaints.  Contractions: Irritability. Vag. Bleeding: None.  Movement: Present. Denies leaking of fluid.   The following portions of the patient's history were reviewed and updated as appropriate: allergies, current medications, past family history, past medical history, past social history, past surgical history and problem list. Problem list updated.  Objective:   Vitals:   03/15/17 1511  BP: 129/80  Pulse: 96  Weight: 218 lb 6.4 oz (99.1 kg)    Fetal Status: Fetal Heart Rate (bpm): 158; doppler Fundal Height: 24 cm Movement: Present     General:  Alert, oriented and cooperative. Patient is in no acute distress.  Skin: Skin is warm and dry. No rash noted.   Cardiovascular: Normal heart rate noted  Respiratory: Normal respiratory effort, no problems with respiration noted  Abdomen: Soft, gravid, appropriate for gestational age.  Pain/Pressure: Present     Pelvic: Cervical exam deferred        Extremities: Normal range of motion.  Edema: Trace  Mental Status:  Normal mood and affect. Normal behavior. Normal judgment and thought content.   Assessment and Plan:  Pregnancy: [id number] at 3986w1d  1. Supervision of high risk pregnancy, antepartum    - Hemoglobinopathy evaluation - Cystic Fibrosis Mutation 97 - HgB A1c - Cervicovaginal ancillary only  2. Rh negative state in antepartum period     Rhogam next ROB/with OGTT  3. History of preterm delivery     Declined 17-P  4. Low vitamin D level     Taking weekly vitamin D.  Preterm labor symptoms and general obstetric  precautions including but not limited to vaginal bleeding, contractions, leaking of fluid and fetal movement were reviewed in detail with the patient. Please refer to After Visit Summary for other counseling recommendations.  [facility] about 4 weeks [facility]around [facility]12[facility]2017[facility]   Roe Coombsachelle A Aza Dantes, CNM

## 2017-03-20 NOTE — Telephone Encounter (Signed)
Patient notified

## 2017-03-22 LAB — CYSTIC FIBROSIS MUTATION 97: GENE DIS ANAL CARRIER INTERP BLD/T-IMP: NOT DETECTED

## 2017-03-28 ENCOUNTER — Other Ambulatory Visit: Payer: Self-pay | Admitting: Certified Nurse Midwife

## 2017-03-28 DIAGNOSIS — O0990 Supervision of high risk pregnancy, unspecified, unspecified trimester: Secondary | ICD-10-CM

## 2017-03-29 DIAGNOSIS — O358XX1 Maternal care for other (suspected) fetal abnormality and damage, fetus 1: Secondary | ICD-10-CM | POA: Diagnosis not present

## 2017-04-06 ENCOUNTER — Telehealth: Payer: Self-pay | Admitting: Pediatrics

## 2017-04-06 DIAGNOSIS — Z3483 Encounter for supervision of other normal pregnancy, third trimester: Secondary | ICD-10-CM

## 2017-04-06 NOTE — Telephone Encounter (Signed)
Pt called stating FMLA was filled out incorrectly.  She states her leave start date is 03/15/2017 and not her EDD 07/04/2017.  I corrected the forms and refaxed to [phone] Forrest MoronATTN Dulce Soria at patient's request.

## 2017-04-11 ENCOUNTER — Other Ambulatory Visit (HOSPITAL_COMMUNITY): Payer: Self-pay | Admitting: Obstetrics and Gynecology

## 2017-04-11 ENCOUNTER — Encounter (HOSPITAL_COMMUNITY): Payer: Self-pay

## 2017-04-11 ENCOUNTER — Ambulatory Visit (HOSPITAL_COMMUNITY)
Admission: RE | Admit: 2017-04-11 | Discharge: 2017-04-11 | Disposition: A | Discharge status: Home / Self Care | Payer: Medicaid Other | Source: Ambulatory Visit | Attending: Certified Nurse Midwife | Admitting: Certified Nurse Midwife

## 2017-04-11 DIAGNOSIS — Q249 Congenital malformation of heart, unspecified: Secondary | ICD-10-CM

## 2017-04-11 DIAGNOSIS — O99213 Obesity complicating pregnancy, third trimester: Secondary | ICD-10-CM

## 2017-04-11 DIAGNOSIS — O358XX0 Maternal care for other (suspected) fetal abnormality and damage, not applicable or unspecified: Secondary | ICD-10-CM | POA: Insufficient documentation

## 2017-04-11 DIAGNOSIS — Q234 Hypoplastic left heart syndrome: Secondary | ICD-10-CM | POA: Diagnosis not present

## 2017-04-11 DIAGNOSIS — Z3A28 28 weeks gestation of pregnancy: Secondary | ICD-10-CM

## 2017-04-11 DIAGNOSIS — E669 Obesity, unspecified: Secondary | ICD-10-CM | POA: Insufficient documentation

## 2017-04-12 ENCOUNTER — Telehealth: Payer: Self-pay

## 2017-04-12 NOTE — Telephone Encounter (Signed)
I need assistance in providing a Dx code for her reason for being taken out of work  To give to disability  I saw the letter from 03/15/17 taking her out And that this pregnancy is high risk  And hx of pre term labor. 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 Please advise

## 2017-04-13 ENCOUNTER — Ambulatory Visit (INDEPENDENT_AMBULATORY_CARE_PROVIDER_SITE_OTHER): Payer: Medicaid Other | Admitting: Obstetrics & Gynecology

## 2017-04-13 ENCOUNTER — Other Ambulatory Visit: Payer: Medicaid Other

## 2017-04-13 VITALS — BP 112/70 | HR 98 | Wt 224.2 lb

## 2017-04-13 DIAGNOSIS — O358XX0 Maternal care for other (suspected) fetal abnormality and damage, not applicable or unspecified: Secondary | ICD-10-CM | POA: Diagnosis not present

## 2017-04-13 DIAGNOSIS — O09213 Supervision of pregnancy with history of pre-term labor, third trimester: Secondary | ICD-10-CM

## 2017-04-13 DIAGNOSIS — Z8751 Personal history of pre-term labor: Secondary | ICD-10-CM

## 2017-04-13 DIAGNOSIS — O09893 Supervision of other high risk pregnancies, third trimester: Secondary | ICD-10-CM | POA: Diagnosis not present

## 2017-04-13 DIAGNOSIS — Z6791 Unspecified blood type, Rh negative: Secondary | ICD-10-CM | POA: Diagnosis not present

## 2017-04-13 DIAGNOSIS — O35BXX0 Maternal care for other (suspected) fetal abnormality and damage, fetal cardiac anomalies, not applicable or unspecified: Secondary | ICD-10-CM

## 2017-04-13 DIAGNOSIS — O0990 Supervision of high risk pregnancy, unspecified, unspecified trimester: Secondary | ICD-10-CM

## 2017-04-13 DIAGNOSIS — O26899 Other specified pregnancy related conditions, unspecified trimester: Secondary | ICD-10-CM

## 2017-04-13 MED ORDER — RHO D IMMUNE GLOBULIN 1500 UNIT/2ML IJ SOSY
300.0000 ug | PREFILLED_SYRINGE | Freq: Once | INTRAMUSCULAR | Status: AC
Start: 2017-04-13 — End: 2017-04-13
  Administered 2017-04-13: 300 ug via INTRAMUSCULAR

## 2017-04-13 NOTE — Patient Instructions (Addendum)
Return to clinic for any scheduled appointments or obstetric concerns, or go to [facility] for evaluation   Third Trimester of Pregnancy The third trimester is from week 28 through week 40 (months 7 through 9). The third trimester is a time when the unborn baby (fetus) is growing rapidly. At the end of the ninth month, the fetus is about 20 inches in length and weighs 6-10 pounds. Body changes during your third trimester Your body will continue to go through many changes during pregnancy. The changes vary from woman to woman. During the third trimester:  Your weight will continue to increase. You can expect to gain 25-35 pounds (11-16 kg) by the end of the pregnancy.  You may begin to get stretch marks on your hips, abdomen, and breasts.  You may urinate more often because the fetus is moving lower into your pelvis and pressing on your bladder.  You may develop or continue to have heartburn. This is caused by increased hormones that slow down muscles in the digestive tract.  You may develop or continue to have constipation because increased hormones slow digestion and cause the muscles that push waste through your intestines to relax.  You may develop hemorrhoids. These are swollen veins (varicose veins) in the rectum that can itch or be painful.  You may develop swollen, bulging veins (varicose veins) in your legs.  You may have increased body aches in the pelvis, back, or thighs. This is due to weight gain and increased hormones that are relaxing your joints.  You may have changes in your hair. These can include thickening of your hair, rapid growth, and changes in texture. Some women also have hair loss during or after pregnancy, or hair that feels dry or thin. Your hair will most likely return to normal after your baby is born.  Your breasts will continue to grow and they will continue to become tender. A yellow fluid (colostrum) may leak from your breasts. This is the first milk you are  producing for your baby.  Your belly button may stick out.  You may notice more swelling in your hands, face, or ankles.  You may have increased tingling or numbness in your hands, arms, and legs. The skin on your belly may also feel numb.  You may feel short of breath because of your expanding uterus.  You may have more problems sleeping. This can be caused by the size of your belly, increased need to urinate, and an increase in your body's metabolism.  You may notice the fetus "dropping," or moving lower in your abdomen (lightening).  You may have increased vaginal discharge.  You may notice your joints feel loose and you may have pain around your pelvic bone.  What to expect at prenatal visits You will have prenatal exams every 2 weeks until week 36. Then you will have weekly prenatal exams. During a routine prenatal visit:  You will be weighed to make sure you and the baby are growing normally.  Your blood pressure will be taken.  Your abdomen will be measured to track your baby's growth.  The fetal heartbeat will be listened to.  Any test results from the previous visit will be discussed.  You may have a cervical check near your due date to see if your cervix has softened or thinned (effaced).  You will be tested for Group B streptococcus. This happens between 35 and 37 weeks.  Your health care provider may ask you:  What your birth plan is.    How you are feeling.  If you are feeling the baby move.  If you have had any abnormal symptoms, such as leaking fluid, bleeding, severe headaches, or abdominal cramping.  If you are using any tobacco products, including cigarettes, chewing tobacco, and electronic cigarettes.  If you have any questions.  Other tests or screenings that may be performed during your third trimester include:  Blood tests that check for low iron levels (anemia).  Fetal testing to check the health, activity level, and growth of the fetus.  Testing is done if you have certain medical conditions or if there are problems during the pregnancy.  Nonstress test (NST). This test checks the health of your baby to make sure there are no signs of problems, such as the baby not getting enough oxygen. During this test, a belt is placed around your belly. The baby is made to move, and its heart rate is monitored during movement.  What is false labor? False labor is a condition in which you feel small, irregular tightenings of the muscles in the womb (contractions) that usually go away with rest, changing position, or drinking water. These are called Braxton Hicks contractions. Contractions may last for hours, days, or even weeks before true labor sets in. If contractions come at regular intervals, become more frequent, increase in intensity, or become painful, you should see your health care provider. What are the signs of labor?  Abdominal cramps.  Regular contractions that start at 10 minutes apart and become stronger and more frequent with time.  Contractions that start on the top of the uterus and spread down to the lower abdomen and back.  Increased pelvic pressure and dull back pain.  A watery or bloody mucus discharge that comes from the vagina.  Leaking of amniotic fluid. This is also known as your "water breaking." It could be a slow trickle or a gush. Let your health care provider know if it has a color or strange odor. If you have any of these signs, call your health care provider right away, even if it is before your due date. Follow these instructions at home: Medicines  Follow your health care provider's instructions regarding medicine use. Specific medicines may be either safe or unsafe to take during pregnancy.  Take a prenatal vitamin that contains at least 600 micrograms (mcg) of folic acid.  If you develop constipation, try taking a stool softener if your health care provider approves. Eating and drinking  Eat a  balanced diet that includes fresh fruits and vegetables, whole grains, good sources of protein such as meat, eggs, or tofu, and low-fat dairy. Your health care provider will help you determine the amount of weight gain that is right for you.  Avoid raw meat and uncooked cheese. These carry germs that can cause birth defects in the baby.  If you have low calcium intake from food, talk to your health care provider about whether you should take a daily calcium supplement.  Eat four or five small meals rather than three large meals a day.  Limit foods that are high in fat and processed sugars, such as fried and sweet foods.  To prevent constipation: ? Drink enough fluid to keep your urine clear or pale yellow. ? Eat foods that are high in fiber, such as fresh fruits and vegetables, whole grains, and beans. Activity  Exercise only as directed by your health care provider. Most women can continue their usual exercise routine during pregnancy. Try to exercise for 30   minutes at least 5 days a week. Stop exercising if you experience uterine contractions.  Avoid heavy lifting.  Do not exercise in extreme heat or humidity, or at high altitudes.  Wear low-heel, comfortable shoes.  Practice good posture.  You may continue to have sex unless your health care provider tells you otherwise. Relieving pain and discomfort  Take frequent breaks and rest with your legs elevated if you have leg cramps or low back pain.  Take warm sitz baths to soothe any pain or discomfort caused by hemorrhoids. Use hemorrhoid cream if your health care provider approves.  Wear a good support bra to prevent discomfort from breast tenderness.  If you develop varicose veins: ? Wear support pantyhose or compression stockings as told by your healthcare provider. ? Elevate your feet for 15 minutes, 3-4 times a day. Prenatal care  Write down your questions. Take them to your prenatal visits.  Keep all your prenatal  visits as told by your health care provider. This is important. Safety  Wear your seat belt at all times when driving.  Make a list of emergency phone numbers, including numbers for family, friends, the hospital, and police and fire departments. General instructions  Avoid cat litter boxes and soil used by cats. These carry germs that can cause birth defects in the baby. If you have a cat, ask someone to clean the litter box for you.  Do not travel far distances unless it is absolutely necessary and only with the approval of your health care provider.  Do not use hot tubs, steam rooms, or saunas.  Do not drink alcohol.  Do not use any products that contain nicotine or tobacco, such as cigarettes and e-cigarettes. If you need help quitting, ask your health care provider.  Do not use any medicinal herbs or unprescribed drugs. These chemicals affect the formation and growth of the baby.  Do not douche or use tampons or scented sanitary pads.  Do not cross your legs for long periods of time.  To prepare for the arrival of your baby: ? Take prenatal classes to understand, practice, and ask questions about labor and delivery. ? Make a trial run to the hospital. ? Visit the hospital and tour the maternity area. ? Arrange for maternity or paternity leave through employers. ? Arrange for family and friends to take care of pets while you are in the hospital. ? Purchase a rear-facing car seat and make sure you know how to install it in your car. ? Pack your hospital bag. ? Prepare the baby's nursery. Make sure to remove all pillows and stuffed animals from the baby's crib to prevent suffocation.  Visit your dentist if you have not gone during your pregnancy. Use a soft toothbrush to brush your teeth and be gentle when you floss. Contact a health care provider if:  You are unsure if you are in labor or if your water has broken.  You become dizzy.  You have mild pelvic cramps, pelvic  pressure, or nagging pain in your abdominal area.  You have lower back pain.  You have persistent nausea, vomiting, or diarrhea.  You have an unusual or bad smelling vaginal discharge.  You have pain when you urinate. Get help right away if:  Your water breaks before 37 weeks.  You have regular contractions less than 5 minutes apart before 37 weeks.  You have a fever.  You are leaking fluid from your vagina.  You have spotting or bleeding from your vagina.    You have severe abdominal pain or cramping.  You have rapid weight loss or weight gain.  You have shortness of breath with chest pain.  You notice sudden or extreme swelling of your face, hands, ankles, feet, or legs.  Your baby makes fewer than 10 movements in 2 hours.  You have severe headaches that do not go away when you take medicine.  You have vision changes. Summary  The third trimester is from week 28 through week 40, months 7 through 9. The third trimester is a time when the unborn baby (fetus) is growing rapidly.  During the third trimester, your discomfort may increase as you and your baby continue to gain weight. You may have abdominal, leg, and back pain, sleeping problems, and an increased need to urinate.  During the third trimester your breasts will keep growing and they will continue to become tender. A yellow fluid (colostrum) may leak from your breasts. This is the first milk you are producing for your baby.  False labor is a condition in which you feel small, irregular tightenings of the muscles in the womb (contractions) that eventually go away. These are called Braxton Hicks contractions. Contractions may last for hours, days, or even weeks before true labor sets in.  Signs of labor can include: abdominal cramps; regular contractions that start at 10 minutes apart and become stronger and more frequent with time; watery or bloody mucus discharge that comes from the vagina; increased pelvic pressure  and dull back pain; and leaking of amniotic fluid. This information is not intended to replace advice given to you by your health care provider. Make sure you discuss any questions you have with your health care provider. Document Released: 02/15/2001 Document Revised: 07/30/2015 Document Reviewed: 04/24/2012 [employer] Interactive Patient Education  2017 [employer]  

## 2017-04-13 NOTE — Progress Notes (Signed)
PRENATAL VISIT NOTE  Subjective:  [name] is a 37 y.o. [id number] at [redacted]w[redacted]d being seen today for ongoing prenatal care.  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  Patient reports no complaints.  Contractions: Not present. Vag. Bleeding: None.  Movement: Present. Denies leaking of fluid.   The following portions of the patient's history were reviewed and updated as appropriate: allergies, current medications, past family history, past medical history, past social history, past surgical history and problem list. Problem list updated.  Objective:   Vitals:   04/13/17 0851  BP: 112/70  Pulse: 98  Weight: 224 lb 3.2 oz (101.7 kg)    Fetal Status: Fetal Heart Rate (bpm): 168 Fundal Height: 29 cm Movement: Present     General:  Alert, oriented and cooperative. Patient is in no acute distress.  Skin: Skin is warm and dry. No rash noted.   Cardiovascular: Normal heart rate noted  Respiratory: Normal respiratory effort, no problems with respiration noted  Abdomen: Soft, gravid, appropriate for gestational age.  Pain/Pressure: Absent     Pelvic: Cervical exam deferred        Extremities: Normal range of motion.  Edema: None  Mental Status:  Normal mood and affect. Normal behavior. Normal judgment and thought content.   [place] Mfm Ob Follow Up  Result Date: 04/11/2017 ----------------------------------------------------------------------  OBSTETRICS REPORT                      (Signed Final 04/11/2017 04:50 pm) ---------------------------------------------------------------------- Patient Info  ID #:       [id number]                          D.O.B.:  07-15-1980 (36 yrs)  Name:       [name]                  Visit Date: 04/11/2017 03:16 pm ---------------------------------------------------------------------- Performed By  Performed By:     Marcellina Millin          Ref. Address:     [address]                                                             [place]  [address]  Attending:        Particia Nearing MD       Location:         [facility]  Referred By:      Roe Coombs CNM ---------------------------------------------------------------------- Orders   #  Description                                 Code   1  [place] MFM OB FOLLOW UP                         [id number]  ----------------------------------------------------------------------   #  Ordered By               Order #        Accession #    Episode #   1  JEFFREY Marjo Bicker           [id number]      [id number]     [id number]  ---------------------------------------------------------------------- Indications   [redacted] weeks gestation of pregnancy                Z3A.28   Fetal cardiac anomaly affecting pregnancy,     O35.8XX0   antepartum; HLHS   Obesity complicating pregnancy, third          O99.213   trimester   Smoking complicating pregnancy, third          O99.333   trimester  ---------------------------------------------------------------------- OB History  Blood Type:            Height:  5'6"   Weight (lb):  209       BMI:  33.73  Gravidity:    14        Term:   2        Prem:   1        SAB:   0  TOP:          0       Ectopic:  0        Living: 3 ---------------------------------------------------------------------- Fetal Evaluation  Num Of Fetuses:     1  Fetal Heart         154  Rate(bpm):  Cardiac Activity:   Observed  Presentation:       Cephalic  Placenta:           Anterior, above cervical os  P. Cord Insertion:  Previously Visualized  Amniotic Fluid  AFI FV:       Subjectively within normal limits  AFI Sum(cm)     %Tile       Largest Pocket(cm)  22.34           92          6.64  RUQ(cm)       RLQ(cm)       LUQ(cm)        LLQ(cm)  6.14          4.65          4.91           6.64 ---------------------------------------------------------------------- Biometry  BPD:      68.7  mm     G. Age:  27w 4d         26  %    CI:  74.88   %    70 - 86                                                          FL/HC:      21.4   %    18.8 - 20.6  HC:      251.9  mm     G. Age:  27w 3d          8  %    HC/AC:      1.08        1.05 - 1.21  AC:       234   mm     G. Age:  27w 5d         35  %    FL/BPD:     78.6   %    71 - 87  FL:         54  mm     G. Age:  28w 4d         53  %    FL/AC:      23.1   %    20 - 24  Est. FW:    1156  gm      2 lb 9 oz     51  % ---------------------------------------------------------------------- Gestational Age  LMP:           28w 0d        Date:  09/27/16                 EDD:   07/04/17  U/S Today:     27w 6d                                        EDD:   07/05/17  Best:          Eden Emms28w 0d     Det. By:  LMP  (09/27/16)          EDD:   07/04/17 ---------------------------------------------------------------------- Anatomy  Cranium:               Appears normal         Aortic Arch:            Not well visualized  Cavum:                 Previously seen        Ductal Arch:            Previously seen  Ventricles:            Appears normal         Diaphragm:              Appears normal  Choroid Plexus:        Previously seen        Stomach:                Appears normal, left  sided  Cerebellum:            Previously seen        Abdomen:                Appears normal  Posterior Fossa:       Previously seen        Abdominal Wall:         Previously seen  Nuchal Fold:           Not applicable (>20    Cord Vessels:           Previously seen                         wks GA)  Face:                   Orbits and profile     Kidneys:                Appear normal                         previously seen  Lips:                  Previously seen        Bladder:                Appears normal  Thoracic:              Appears normal         Spine:                  Previously seen  Heart:                 Abnormal, see          Upper Extremities:      Previously seen                         comments  RVOT:                  Abnormal, see          Lower Extremities:      Previously seen                         comments  LVOT:                  Abnormal, see                         comments  Other:  Fetus appears to be a female. Nasal bone previously visualized.          Technically difficult due to fetal position. ---------------------------------------------------------------------- Cervix Uterus Adnexa  Cervix  Length:            3.6  cm.  Normal appearance by transabdominal scan. ---------------------------------------------------------------------- Impression  Singleton intrauterine pregnancy at 28 weeks 0 days  gestation with fetal cardiac activity  Cephalic presentation  Hypoplastic left heart syndrome  All other interval fetal anatomy was seen and appeared  normal  Normal amniotic fluid volume  Appropriate interval growth with EFW at the 51st %tile  Anterior placenta  Normal appearing cervical length ---------------------------------------------------------------------- Recommendations  Follow-up ultrasound for growth in 4 weeks  Plan: deliver at [facility] ----------------------------------------------------------------------  Particia Nearing, MD Electronically Signed Final Report   04/11/2017 04:50 pm ----------------------------------------------------------------------  [place] Mfm Ob Follow Up  Result Date: 03/14/2017 ----------------------------------------------------------------------  OBSTETRICS REPORT                      (Signed Final 03/14/2017 04:54 pm)  ---------------------------------------------------------------------- Patient Info  ID #:       [id number]                          D.O.B.:  December 24, 1980 (36 yrs)  Name:       [name]                 Visit Date: 03/14/2017 02:50 pm ---------------------------------------------------------------------- Performed By  Performed By:     Vivien Rota        Ref. Address:     [address]                                                             [place]                                                             [address]  Attending:        Durwin Nora       Location:         [facility]                    MD  Referred By:      Roe Coombs CNM ---------------------------------------------------------------------- Orders   #  Description                                 Code   1  [place] MFM OB FOLLOW UP                         [id number]  ----------------------------------------------------------------------   #  Ordered By               Order #        Accession #    Episode #   1  Particia Nearing            [id number]      [id number]     [id number]  ---------------------------------------------------------------------- Indications   [redacted] weeks gestation of pregnancy  Z3A.24   Obesity complicating pregnancy, second         O99.212   trimester   Smoking complicating pregnancy, second         O99.332   trimester   Poor obstetric history: Previous preterm       O09.219   delivery, antepartum (34 weeks)   Poor obstetric history: Previous               O09.299   preeclampsia / eclampsia/gestational HTN   Encounter for other antenatal screening        Z36.2   follow-up  ---------------------------------------------------------------------- OB History  Blood Type:            Height:  5'6"   Weight (lb):  209       BMI:  33.73  Gravidity:    14        Term:   2        Prem:   1         SAB:   0  TOP:          0       Ectopic:  0        Living: 3 ---------------------------------------------------------------------- Fetal Evaluation  Num Of Fetuses:     1  Fetal Heart         165  Rate(bpm):  Cardiac Activity:   Observed  Presentation:       Cephalic  Placenta:           Anterior, above cervical os  P. Cord Insertion:  Previously Visualized  Amniotic Fluid  AFI FV:      Subjectively within normal limits                              Largest Pocket(cm)                              5.72 ---------------------------------------------------------------------- Biometry  BPD:      56.9  mm     G. Age:  23w 3d         22  %    CI:        71.09   %    70 - 86                                                          FL/HC:      19.3   %    18.7 - 20.9  HC:       215   mm     G. Age:  23w 4d         19  %    HC/AC:      1.11        1.05 - 1.21  AC:      194.2  mm     G. Age:  24w 1d         45  %    FL/BPD:     72.9   %    71 - 87  FL:       41.5  mm     G. Age:  23w 4d  23  %    FL/AC:      21.4   %    20 - 24  HUM:      40.5  mm     G. Age:  24w 4d         56  %  Est. FW:     630  gm      1 lb 6 oz     48  % ---------------------------------------------------------------------- Gestational Age  LMP:           24w 0d        Date:  09/27/16                 EDD:   07/04/17  U/S Today:     23w 5d                                        EDD:   07/06/17  Best:          24w 0d     Det. By:  LMP  (09/27/16)          EDD:   07/04/17 ---------------------------------------------------------------------- Anatomy  Cranium:               Appears normal         Aortic Arch:            Not well visualized  Cavum:                 Previously seen        Ductal Arch:            Previously seen  Ventricles:            Previously seen        Diaphragm:              Previously seen  Choroid Plexus:        Previously seen        Stomach:                Appears normal, left                                                                         sided  Cerebellum:            Previously seen        Abdomen:                Appears normal  Posterior Fossa:       Previously seen        Abdominal Wall:         Previously seen  Nuchal Fold:           Not applicable (>20    Cord Vessels:           Previously seen                         wks GA)  Face:                  Orbits and profile  Kidneys:                Appear normal                         previously seen  Lips:                  Previously seen        Bladder:                Appears normal  Thoracic:              Appears normal         Spine:                  Previously seen  Heart:                 Abnormal, see          Upper Extremities:      Previously seen                         comments  RVOT:                  Previously seen        Lower Extremities:      Previously seen  LVOT:                  Abnormal, see                         comments  Other:  Fetus appears to be a female. Nasal bone previously visualized.          Technically difficult due to fetal position. ---------------------------------------------------------------------- Cervix Uterus Adnexa  Cervix  Length:           3.81  cm.  Normal appearance by transabdominal scan.  Uterus  No abnormality visualized.  Left Ovary  Not visualized.  Right Ovary  Not visualized.  Adnexa:       No abnormality visualized. No adnexal mass                visualized. ---------------------------------------------------------------------- Impression  SIUP at 24+0 weeks  active fetus  EFW 48th%'le  Known Congenital heart defect is again seen today:  hypoplastic left heart - ventricle, atrium and outflow tract; VSD  All other detailed fetal anatomy was seen and appeared  normal  Normal amniotic fluid volume  Measurements consistent with appropriate interval growth  The US findings were shared with Ms. Klute. She reports  that she and her husband are considering delivery at [facility] to  facilitate postnatal care of her baby with needed staged   surgical interventions.  She states this is more convenient for  her than other regional care centers that would likewise be  appropriate. ---------------------------------------------------------------------- Recommendations  1. I feel that if she is certain that she desires to get an opinion  at [facility], a referral request sent by her OB provider would  indeed be appropriate so that all of her records can be made  available.  I have notified our unit to request a second opinion  at [facility] pediatric cardiology to initiate the needed planning  through their institution.  2. continued interval growth surveillance has been arranged  to occur here in our ultrasound unit at [facility]).  3. labor precautions given (noting she knows that  if they  should occur that she should present locally to be evaluated  and that transfer may be arranged thereafter if appropriate). ----------------------------------------------------------------------               Durwin Nora, MD Electronically Signed Final Report   03/14/2017 04:54 pm ----------------------------------------------------------------------   Assessment and Plan:  Pregnancy: [id number] at [redacted]w[redacted]d  1. 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 According to patient, today is her last prenatal visit with [place]; will continue her care at [facility].   2. History of preterm delivery Declined 17P; PTL precautions given.   3. Rh negative state in antepartum period - rho (d) immune globulin (RHIG/RHOPHYLAC) injection 300 mcg given today.  4. Supervision of high risk pregnancy, antepartum Third trimester labs checked today. She declined Tdap vaccine. - Glucose Tolerance, 2 Hours w/1 Hour - CBC - HIV antibody - RPR  Preterm labor symptoms and general obstetric precautions including but not limited to vaginal bleeding, contractions, leaking of fluid and fetal movement  were reviewed in detail with the patient. Please refer to After Visit Summary for other counseling recommendations.  Return in about 2 weeks (around 04/27/2017) for OB Visit.   Jaynie Collins, MD

## 2017-04-14 LAB — RPR: RPR: NONREACTIVE

## 2017-04-14 LAB — CBC
HEMATOCRIT: 31.8 % — AB (ref 34.0–46.6)
HEMOGLOBIN: 10 g/dL — AB (ref 11.1–15.9)
MCH: 24.5 pg — AB (ref 26.6–33.0)
MCHC: 31.4 g/dL — ABNORMAL LOW (ref 31.5–35.7)
MCV: 78 fL — ABNORMAL LOW (ref 79–97)
Platelets: 279 10*3/uL (ref 150–379)
RBC: 4.08 x10E6/uL (ref 3.77–5.28)
RDW: 17.9 % — ABNORMAL HIGH (ref 12.3–15.4)
WBC: 9.7 10*3/uL (ref 3.4–10.8)

## 2017-04-14 LAB — GLUCOSE TOLERANCE, 2 HOURS W/ 1HR
GLUCOSE, 1 HOUR: 183 mg/dL — AB (ref 65–179)
Glucose, 2 hour: 153 mg/dL — ABNORMAL HIGH (ref 65–152)
Glucose, Fasting: 89 mg/dL (ref 65–91)

## 2017-04-14 LAB — HIV ANTIBODY (ROUTINE TESTING W REFLEX): HIV SCREEN 4TH GENERATION: NONREACTIVE

## 2017-04-17 ENCOUNTER — Encounter: Payer: Self-pay | Admitting: Obstetrics & Gynecology

## 2017-04-17 DIAGNOSIS — O24419 Gestational diabetes mellitus in pregnancy, unspecified control: Secondary | ICD-10-CM | POA: Insufficient documentation

## 2017-04-25 ENCOUNTER — Other Ambulatory Visit: Payer: Self-pay

## 2017-04-25 DIAGNOSIS — O24419 Gestational diabetes mellitus in pregnancy, unspecified control: Secondary | ICD-10-CM

## 2017-04-25 MED ORDER — ACCU-CHEK FASTCLIX LANCETS MISC: 1.0000 | Freq: Four times a day (QID) | 12 refills | Status: DC

## 2017-04-25 MED ORDER — ACCU-CHEK GUIDE W/DEVICE KIT: 1.0000 | PACK | Freq: Four times a day (QID) | 0 refills | Status: DC

## 2017-04-25 NOTE — Progress Notes (Signed)
dia

## 2017-05-09 ENCOUNTER — Ambulatory Visit (HOSPITAL_COMMUNITY)
Admission: RE | Admit: 2017-05-09 | Discharge: 2017-05-09 | Disposition: A | Discharge status: Home / Self Care | Payer: Medicaid Other | Source: Ambulatory Visit | Attending: Certified Nurse Midwife | Admitting: Certified Nurse Midwife

## 2017-05-11 ENCOUNTER — Encounter: Payer: Self-pay | Admitting: *Deleted

## 2017-05-26 MED ORDER — MISOPROSTOL 200 MCG PO TABS
800.00 | ORAL_TABLET | ORAL | Status: DC
Start: ? — End: 2017-05-26

## 2017-05-26 MED ORDER — SIMETHICONE 80 MG PO CHEW
80.00 | CHEWABLE_TABLET | ORAL | Status: DC
Start: ? — End: 2017-05-26

## 2017-05-26 MED ORDER — ALUM & MAG HYDROXIDE-SIMETH 400-400-40 MG/5ML PO SUSP
15.00 | ORAL | Status: DC
Start: ? — End: 2017-05-26

## 2017-05-26 MED ORDER — TUCKS 50 % EX PADS
1.00 | MEDICATED_PAD | CUTANEOUS | Status: DC
Start: ? — End: 2017-05-26

## 2017-05-26 MED ORDER — ONDANSETRON HCL 4 MG/2ML IJ SOLN
4.00 | INTRAMUSCULAR | Status: DC
Start: ? — End: 2017-05-26

## 2017-05-26 MED ORDER — DIPHENHYDRAMINE HCL 25 MG PO CAPS
50.00 | ORAL_CAPSULE | ORAL | Status: DC
Start: ? — End: 2017-05-26

## 2017-05-26 MED ORDER — DIPHENOXYLATE-ATROPINE 2.5-0.025 MG PO TABS
2.00 | ORAL_TABLET | ORAL | Status: DC
Start: ? — End: 2017-05-26

## 2017-05-26 MED ORDER — METHYLERGONOVINE MALEATE 0.2 MG/ML IJ SOLN
.20 | INTRAMUSCULAR | Status: DC
Start: ? — End: 2017-05-26

## 2017-05-26 MED ORDER — OXYTOCIN IJ
10.00 | INTRAMUSCULAR | Status: DC
Start: ? — End: 2017-05-26

## 2017-05-26 MED ORDER — CARBOPROST TROMETHAMINE 250 MCG/ML IM SOLN
250.00 | INTRAMUSCULAR | Status: DC
Start: ? — End: 2017-05-26

## 2017-05-26 MED ORDER — DOCUSATE SODIUM 100 MG PO CAPS
100.00 | ORAL_CAPSULE | ORAL | Status: DC
Start: 2017-05-26 — End: 2017-05-26

## 2017-05-26 MED ORDER — ACETAMINOPHEN 325 MG PO TABS
650.00 | ORAL_TABLET | ORAL | Status: DC
Start: ? — End: 2017-05-26

## 2017-05-26 MED ORDER — OXYTOCIN 10 UNIT/ML IJ SOLN
10.00 | INTRAMUSCULAR | Status: DC
Start: ? — End: 2017-05-26

## 2017-05-26 MED ORDER — BENZOCAINE 20 % EX AERO
1.00 | INHALATION_SPRAY | CUTANEOUS | Status: DC
Start: ? — End: 2017-05-26

## 2017-05-26 MED ORDER — IBUPROFEN 600 MG PO TABS
600.00 | ORAL_TABLET | ORAL | Status: DC
Start: ? — End: 2017-05-26

## 2017-05-26 MED ORDER — IRON PO
1.00 | ORAL | Status: DC
Start: 2017-05-27 — End: 2017-05-26

## 2017-06-06 ENCOUNTER — Ambulatory Visit (HOSPITAL_COMMUNITY): Payer: BLUE CROSS/BLUE SHIELD

## 2017-06-13 ENCOUNTER — Ambulatory Visit: Payer: Medicaid Other | Admitting: Certified Nurse Midwife

## 2017-06-14 ENCOUNTER — Encounter: Payer: Self-pay | Admitting: Certified Nurse Midwife

## 2017-06-14 ENCOUNTER — Ambulatory Visit (INDEPENDENT_AMBULATORY_CARE_PROVIDER_SITE_OTHER): Payer: Commercial Managed Care - PPO | Admitting: Certified Nurse Midwife

## 2017-06-14 VITALS — BP 133/93 | HR 81 | Wt 215.0 lb

## 2017-06-14 DIAGNOSIS — Z1389 Encounter for screening for other disorder: Secondary | ICD-10-CM | POA: Diagnosis not present

## 2017-06-14 DIAGNOSIS — O364XX0 Maternal care for intrauterine death, not applicable or unspecified: Secondary | ICD-10-CM

## 2017-06-14 NOTE — Progress Notes (Signed)
Subjective:     Kathy Piananika R Navarro is a 37 y.o. female who presents for a postpartum visit. She is 2 weeks postpartum following a spontaneous vaginal delivery. I have fully reviewed the prenatal and intrapartum course. The delivery was at [redacted] weeks gestational weeks. Outcome: IUFD d/t [facility]oplastic [facility] heart [facility]. Bleeding staining only. Bowel function is normal. Bladder function is normal. Patient is not sexually active. Contraception method is abstinence. Postpartum depression screening: positive.  Tobacco, alcohol and substance abuse history reviewed.  Adult immunizations reviewed including TDAP, rubella and varicella.  Denies homicidal/suicidal thoughts.  Aware of contraception options.    The following portions of the patient's history were reviewed and updated as appropriate: allergies, current medications, past family history, past medical history, past social history, past surgical history and problem list.  Review of Systems Pertinent items are noted in HPI. Pertinent items noted in HPI and remainder of comprehensive ROS otherwise negative.   Objective:    BP (!) 133/93   Pulse 81   Wt 215 lb (97.5 kg)   LMP 09/27/2016   BMI 34.70 kg/m   General:  alert, cooperative and no distress   Breasts:  inspection negative, no nipple discharge or bleeding, no masses or nodularity palpable  Lungs: clear to auscultation bilaterally  Heart:  regular rate and rhythm, S1, S2 normal, no murmur, click, rub or gallop  Abdomen: soft, non-tender; bowel sounds normal; no masses,  no organomegaly  Pelvic/Rectal Exam: Not performed.          50% of 30 min visit spent on counseling and coordination of care.  Assessment:     Normal 2 week postpartum exam. Pap smear not done at today's visit.     Postpartum depression r/t IUFD  Plan:    1. Contraception: abstinence 2. Declined contraception today.  Aware of options.  [facility] counseling referral sent.  3. Follow up in: 4  weeks or as needed.  2hr GTT for h/o GDM/screening for DM q 3 yrs per ADA recommendations Preconception counseling provided Healthy lifestyle practices reviewed

## 2017-08-08 ENCOUNTER — Ambulatory Visit (INDEPENDENT_AMBULATORY_CARE_PROVIDER_SITE_OTHER): Payer: Commercial Managed Care - PPO

## 2017-08-08 DIAGNOSIS — N912 Amenorrhea, unspecified: Secondary | ICD-10-CM

## 2017-08-08 LAB — POCT URINE PREGNANCY: Preg Test, Ur: POSITIVE — AB

## 2017-08-08 NOTE — Progress Notes (Signed)
I have reviewed this chart and agree with the RN/CMA assessment and management.    K. Meryl Trecia Maring, M.D. Attending Obstetrician & Gynecologist, [facility], [facility]  

## 2017-08-08 NOTE — Progress Notes (Signed)
Pt here for a pregnancy test. Test today is positive. LMP 07/02/17. Pt is 8640w2d today. EDD 04/08/18.

## 2017-08-16 ENCOUNTER — Telehealth: Payer: Self-pay | Admitting: *Deleted

## 2017-08-16 NOTE — Telephone Encounter (Signed)
Attempt to contact pt regarding disability paperwork. LM on VM to call office.

## 2017-09-12 ENCOUNTER — Ambulatory Visit (INDEPENDENT_AMBULATORY_CARE_PROVIDER_SITE_OTHER): Payer: Commercial Managed Care - PPO | Admitting: Certified Nurse Midwife

## 2017-09-12 ENCOUNTER — Encounter: Payer: Self-pay | Admitting: Certified Nurse Midwife

## 2017-09-12 ENCOUNTER — Other Ambulatory Visit (HOSPITAL_COMMUNITY)
Admission: RE | Admit: 2017-09-12 | Discharge: 2017-09-12 | Disposition: A | Discharge status: Home / Self Care | Payer: Commercial Managed Care - PPO | Source: Ambulatory Visit | Attending: Certified Nurse Midwife | Admitting: Certified Nurse Midwife

## 2017-09-12 VITALS — BP 126/77 | HR 82 | Wt 227.0 lb

## 2017-09-12 DIAGNOSIS — Z3A00 Weeks of gestation of pregnancy not specified: Secondary | ICD-10-CM | POA: Diagnosis not present

## 2017-09-12 DIAGNOSIS — O0990 Supervision of high risk pregnancy, unspecified, unspecified trimester: Secondary | ICD-10-CM | POA: Insufficient documentation

## 2017-09-12 DIAGNOSIS — O09299 Supervision of pregnancy with other poor reproductive or obstetric history, unspecified trimester: Secondary | ICD-10-CM

## 2017-09-13 ENCOUNTER — Encounter: Payer: Self-pay | Admitting: Certified Nurse Midwife

## 2017-09-13 LAB — OBSTETRIC PANEL, INCLUDING HIV
Antibody Screen: NEGATIVE
BASOS ABS: 0 10*3/uL (ref 0.0–0.2)
BASOS: 0 %
EOS (ABSOLUTE): 0.1 10*3/uL (ref 0.0–0.4)
Eos: 2 %
HEMATOCRIT: 40.9 % (ref 34.0–46.6)
HEP B S AG: NEGATIVE
HIV Screen 4th Generation wRfx: NONREACTIVE
Hemoglobin: 13.6 g/dL (ref 11.1–15.9)
IMMATURE GRANS (ABS): 0 10*3/uL (ref 0.0–0.1)
Immature Granulocytes: 0 %
Lymphocytes Absolute: 1.7 10*3/uL (ref 0.7–3.1)
Lymphs: 27 %
MCH: 28.8 pg (ref 26.6–33.0)
MCHC: 33.3 g/dL (ref 31.5–35.7)
MCV: 87 fL (ref 79–97)
MONOCYTES: 5 %
Monocytes Absolute: 0.3 10*3/uL (ref 0.1–0.9)
Neutrophils Absolute: 4.1 10*3/uL (ref 1.4–7.0)
Neutrophils: 66 %
Platelets: 289 10*3/uL (ref 150–450)
RBC: 4.72 x10E6/uL (ref 3.77–5.28)
RDW: 15.7 % — AB (ref 12.3–15.4)
RPR: NONREACTIVE
RUBELLA: 6.3 {index} (ref >0.99)
Rh Factor: NEGATIVE
WBC: 6.3 10*3/uL (ref 3.4–10.8)

## 2017-09-13 LAB — URINE CYTOLOGY ANCILLARY ONLY
CHLAMYDIA, DNA PROBE: NEGATIVE
Neisseria Gonorrhea: NEGATIVE

## 2017-09-13 LAB — HEMOGLOBIN A1C
Est. average glucose Bld gHb Est-mCnc: 120 mg/dL
HEMOGLOBIN A1C: 5.8 % — AB (ref 4.8–5.6)

## 2017-09-13 LAB — VITAMIN D 25 HYDROXY (VIT D DEFICIENCY, FRACTURES): VIT D 25 HYDROXY: 24.3 ng/mL — AB (ref 30.0–100.0)

## 2017-09-13 NOTE — Progress Notes (Signed)
Subjective:   [name] is a 37 y.o. [id number] at 28w3dby LMP being seen today for her first obstetrical visit.  Her obstetrical history is significant for IUFD at 30 wks d/t hypoplastic left heart diagnosed prenatally and was followed by [facility], hx of GDM. Patient does intend to breast feed. Pregnancy history fully reviewed.  Patient reports heartburn, no bleeding, no contractions, no cramping and no leaking.  HISTORY: OB History  Gravida Para Term Preterm AB Living  '15 4 2 2 10 3  ' SAB TAB Ectopic Multiple Live Births  1 9 0 0 3    # Outcome Date GA Lbr Len/2nd Weight Sex Delivery Anes PTL Lv  15 Current           14 Preterm 2019 366w0d4 lb 3 oz (1.899 kg) M    FD     Birth Comments: Hypoplastic left heart syndrome  13 SAB 2010 7w22w0d      12 Term 05/24/07    M Vag-Spont   LIV  11 Term 04/01/00    F Vag-Spont   LIV  10 Preterm 05/22/98 34w93w0d Vag-Spont   LIV  9 TAB           8 TAB           7 TAB           6 TAB           5 TAB           4 TAB           3 TAB           2 TAB           1 TAB             Last pap smear was done 12/30/16 and was normal  Past Medical History:  Diagnosis Date  . Anemia    Past Surgical History:  Procedure Laterality Date  . TONSILLECTOMY  2017   Family History  Problem Relation Age of Onset  . Hyperlipidemia Mother    Social History   Tobacco Use  . Smoking status: Former Smoker    Types: Cigarettes    Last attempt to quit: 2018    Years since quitting: 1.5  . Smokeless tobacco: Never Used  Substance Use Topics  . Alcohol use: No    Comment: occ  . Drug use: No   No Known Allergies Current Outpatient Medications on File Prior to Visit  Medication Sig Dispense Refill  . omeprazole (PRILOSEC) 20 MG capsule Take 1 capsule (20 mg total) by mouth 2 (two) times daily before a meal. 60 capsule 5  . prenatal vitamin w/FE, FA (PRENATAL 1 + 1) 27-1 MG TABS tablet Take 1 tablet by mouth daily at 12 noon. 30 each 12  .  Vitamin D, Ergocalciferol, (DRISDOL) 50000 units CAPS capsule Take 1 capsule (50,000 Units total) by mouth every 7 (seven) days. 30 capsule 2  . ACCU-CHEK FASTCLIX LANCETS MISC 1 Device by Percutaneous route 4 (four) times daily. (Patient not taking: Reported on 09/12/2017) 100 each 12  . aspirin 81 MG chewable tablet Chew 1 tablet (81 mg total) by mouth daily. (Patient not taking: Reported on 04/13/2017) 30 tablet 12  . Blood Glucose Monitoring Suppl (ACCU-CHEK GUIDE) w/Device KIT 1 Device by Does not apply route 4 (four) times daily. (Patient not taking: Reported on 09/12/2017) 1 kit 0  No current facility-administered medications on file prior to visit.     Review of Systems Pertinent items noted in HPI and remainder of comprehensive ROS otherwise negative.  Exam   Vitals:   09/12/17 1519  BP: 126/77  Pulse: 82  Weight: 227 lb (103 kg)      Uterus:     Pelvic Exam: Perineum: no hemorrhoids, normal perineum   Vulva: normal external genitalia, no lesions   Vagina:  normal mucosa, normal discharge   Cervix: No CMT   Adnexa: normal adnexa and no mass, fullness, tenderness   Bony Pelvis: average  System: General: well-developed, well-nourished female in no acute distress   Breast:  normal appearance, no masses or tenderness   Skin: normal coloration and turgor, no rashes   Neurologic: oriented, normal, negative, normal mood   Extremities: normal strength, tone, and muscle mass, ROM of all joints is normal   HEENT PERRLA, extraocular movement intact and sclera clear, anicteric   Mouth/Teeth mucous membranes moist, pharynx normal without lesions and dental hygiene good   Neck supple and no masses   Cardiovascular: regular rate and rhythm   Respiratory:  no respiratory distress, normal breath sounds   Abdomen: soft, non-tender; bowel sounds normal; no masses,  no organomegaly     Assessment:   Pregnancy: [id number] Patient Active Problem List   Diagnosis Date Noted  . Elevated  hemoglobin A1c 03/20/2017  . Low vitamin D level 01/04/2017  . Supervision of high risk pregnancy, antepartum 12/30/2016     Plan:  1. Supervision of high risk pregnancy, antepartum      - Obstetric Panel, Including HIV - Hemoglobin A1c - Urine cytology ancillary only - Vitamin D (25 hydroxy) - Genetic Screening - US OB Comp Less 14 Wks; Future - US OB Transvaginal; Future - Culture, OB Urine  2. Previous child with cardiac abnormality, antepartum    - ECHO FETAL; Future   Initial labs drawn. Continue prenatal vitamins. Genetic Screening discussed, NIPS: ordered. Ultrasound discussed; fetal anatomic survey: ordered. Problem list reviewed and updated. The nature of [place] with multiple MDs and other Advanced Practice Providers was explained to patient; also emphasized that residents, students are part of our team. Routine obstetric precautions reviewed. Return in about 1 month (around 10/10/2017) for [facility].     Kandis Cocking, [facility], [place]

## 2017-09-14 ENCOUNTER — Other Ambulatory Visit: Payer: Self-pay | Admitting: Certified Nurse Midwife

## 2017-09-14 ENCOUNTER — Telehealth: Payer: Self-pay

## 2017-09-14 DIAGNOSIS — O0990 Supervision of high risk pregnancy, unspecified, unspecified trimester: Secondary | ICD-10-CM

## 2017-09-14 DIAGNOSIS — R7309 Other abnormal glucose: Secondary | ICD-10-CM

## 2017-09-14 DIAGNOSIS — R7989 Other specified abnormal findings of blood chemistry: Secondary | ICD-10-CM

## 2017-09-14 LAB — URINE CULTURE, OB REFLEX

## 2017-09-14 LAB — CULTURE, OB URINE

## 2017-09-14 MED ORDER — VITAMIN D (ERGOCALCIFEROL) 1.25 MG (50000 UNIT) PO CAPS: 50000.0000 [IU] | ORAL_CAPSULE | ORAL | 2 refills | Status: DC

## 2017-09-14 NOTE — Telephone Encounter (Signed)
Left VM message to call office.

## 2017-09-14 NOTE — Telephone Encounter (Signed)
-----   Message from Roe Coombsachelle A Denney, CNM sent at 09/14/2017  9:48 AM EDT ----- Needs early 2 hour OGTT for elevated A1C and history of GDM Please let her know that her vitamin D level is low.  Vitamin D weekly tablet has been sent to her pharmacy for her to take.  Thank you.  R.Denney CNM

## 2017-09-19 ENCOUNTER — Ambulatory Visit (HOSPITAL_COMMUNITY): Payer: Commercial Managed Care - PPO

## 2017-09-19 ENCOUNTER — Ambulatory Visit (HOSPITAL_COMMUNITY)
Admission: RE | Admit: 2017-09-19 | Discharge: 2017-09-19 | Disposition: A | Discharge status: Home / Self Care | Payer: Commercial Managed Care - PPO | Source: Ambulatory Visit | Attending: Certified Nurse Midwife | Admitting: Certified Nurse Midwife

## 2017-09-19 ENCOUNTER — Encounter: Payer: Self-pay | Admitting: Certified Nurse Midwife

## 2017-09-19 DIAGNOSIS — Z3A10 10 weeks gestation of pregnancy: Secondary | ICD-10-CM | POA: Diagnosis not present

## 2017-09-19 DIAGNOSIS — O0990 Supervision of high risk pregnancy, unspecified, unspecified trimester: Secondary | ICD-10-CM | POA: Diagnosis present

## 2017-09-20 DIAGNOSIS — Z3482 Encounter for supervision of other normal pregnancy, second trimester: Secondary | ICD-10-CM

## 2017-09-27 ENCOUNTER — Encounter: Payer: Self-pay | Admitting: *Deleted

## 2017-10-02 ENCOUNTER — Other Ambulatory Visit: Payer: Commercial Managed Care - PPO

## 2017-10-02 DIAGNOSIS — R7309 Other abnormal glucose: Secondary | ICD-10-CM

## 2017-10-02 NOTE — Progress Notes (Signed)
Early  2 hr. GTT

## 2017-10-03 ENCOUNTER — Encounter: Payer: Self-pay | Admitting: Nurse Practitioner

## 2017-10-03 LAB — GLUCOSE TOLERANCE, 2 HOURS W/ 1HR
GLUCOSE, 1 HOUR: 192 mg/dL — AB (ref 65–179)
GLUCOSE, 2 HOUR: 170 mg/dL — AB (ref 65–152)
GLUCOSE, FASTING: 91 mg/dL (ref 65–91)

## 2017-10-05 ENCOUNTER — Telehealth: Payer: Self-pay

## 2017-10-05 MED ORDER — GLUCOSE BLOOD VI STRP: ORAL_STRIP | 12 refills | Status: DC

## 2017-10-05 MED ORDER — ACCU-CHEK FASTCLIX LANCETS MISC: 1.0000 | Freq: Four times a day (QID) | 12 refills | Status: DC

## 2017-10-05 NOTE — Telephone Encounter (Signed)
Attempted to contact about 2hr results, female answered, left message to call. Sent glucose supplies to pharmacy

## 2017-10-10 DIAGNOSIS — Z3482 Encounter for supervision of other normal pregnancy, second trimester: Secondary | ICD-10-CM

## 2017-10-11 ENCOUNTER — Encounter: Payer: Commercial Managed Care - PPO | Admitting: Obstetrics and Gynecology

## 2017-10-19 ENCOUNTER — Encounter: Payer: Self-pay | Admitting: *Deleted

## 2017-10-19 ENCOUNTER — Ambulatory Visit (INDEPENDENT_AMBULATORY_CARE_PROVIDER_SITE_OTHER): Payer: Commercial Managed Care - PPO | Admitting: Obstetrics and Gynecology

## 2017-10-19 ENCOUNTER — Encounter: Payer: Self-pay | Admitting: Obstetrics and Gynecology

## 2017-10-19 VITALS — BP 121/79 | HR 87 | Wt 225.0 lb

## 2017-10-19 DIAGNOSIS — O26899 Other specified pregnancy related conditions, unspecified trimester: Secondary | ICD-10-CM

## 2017-10-19 DIAGNOSIS — O09299 Supervision of pregnancy with other poor reproductive or obstetric history, unspecified trimester: Secondary | ICD-10-CM

## 2017-10-19 DIAGNOSIS — O24410 Gestational diabetes mellitus in pregnancy, diet controlled: Secondary | ICD-10-CM

## 2017-10-19 DIAGNOSIS — Z6791 Unspecified blood type, Rh negative: Secondary | ICD-10-CM

## 2017-10-19 DIAGNOSIS — O0990 Supervision of high risk pregnancy, unspecified, unspecified trimester: Secondary | ICD-10-CM

## 2017-10-19 MED ORDER — ASPIRIN 81 MG PO CHEW: 81.0000 mg | CHEWABLE_TABLET | Freq: Every day | ORAL | 12 refills | Status: DC

## 2017-10-19 NOTE — Progress Notes (Signed)
   PRENATAL VISIT NOTE  Subjective:  Kathy Piananika R Navarro is a 37 y.o. [id number] at 4975w3d being seen today for ongoing prenatal care.  She is currently monitored for the following issues for this high-risk pregnancy and has Supervision of high risk pregnancy, antepartum; Rh negative state in antepartum period; Low vitamin D level; Elevated hemoglobin A1c; and Gestational diabetes on their problem list.  Patient reports no complaints.  Contractions: Not present. Vag. Bleeding: None.   . Denies leaking of fluid.   The following portions of the patient's history were reviewed and updated as appropriate: allergies, current medications, past family history, past medical history, past social history, past surgical history and problem list. Problem list updated.  Objective:   Vitals:   10/19/17 1001  BP: 121/79  Pulse: 87  Weight: 225 lb (102.1 kg)    Fetal Status:           General:  Alert, oriented and cooperative. Patient is in no acute distress.  Skin: Skin is warm and dry. No rash noted.   Cardiovascular: Normal heart rate noted  Respiratory: Normal respiratory effort, no problems with respiration noted  Abdomen: Soft, gravid, appropriate for gestational age.  Pain/Pressure: Absent     Pelvic: Cervical exam deferred        Extremities: Normal range of motion.  Edema: Trace  Mental Status: Normal mood and affect. Normal behavior. Normal judgment and thought content.   Assessment and Plan:  Pregnancy: [id number] at 6675w3d  1. Supervision of high risk pregnancy, antepartum Patient is doing well without complaints AFP today Anatomy ultrasound ordered - AFP, Serum, Open Spina Bifida - US MFM OB DETAIL +14 WK; Future - Referral to [facility]  2. Diet controlled gestational diabetes mellitus (GDM) in second trimester Patient has not been checking CBG as she was not able to pick up her testing supplies Patient has not received education on diabetes management- order  placed Baseline labs ordered Fetal echo ordered Remind patient to take daily ASA - US Fetal Echocardiography; Future  3. Rh negative state in antepartum period Rhogam at 28 weeks  Preterm labor symptoms and general obstetric precautions including but not limited to vaginal bleeding, contractions, leaking of fluid and fetal movement were reviewed in detail with the patient. Please refer to After Visit Summary for other counseling recommendations.  Return in about 3 weeks (around 11/09/2017) for ROB.  No future appointments.  Catalina AntiguaPeggy Kioni Stahl, MD

## 2017-10-19 NOTE — Progress Notes (Signed)
Pt is G1P3 7164w3d here for ROB visit. Pt states she has not been to diabetic teaching or started taking her BG levels yet.

## 2017-10-20 LAB — COMPREHENSIVE METABOLIC PANEL
ALT: 12 IU/L (ref 0–32)
AST: 11 IU/L (ref 0–40)
Albumin/Globulin Ratio: 1.6 (ref 1.2–2.2)
Albumin: 3.9 g/dL (ref 3.5–5.5)
Alkaline Phosphatase: 58 IU/L (ref 39–117)
BUN/Creatinine Ratio: 7 — ABNORMAL LOW (ref 9–23)
BUN: 4 mg/dL — ABNORMAL LOW (ref 6–20)
Bilirubin Total: <0.2 mg/dL (ref 0.0–1.2)
CALCIUM: 9.4 mg/dL (ref 8.7–10.2)
CO2: 17 mmol/L — AB (ref 20–29)
CREATININE: 0.55 mg/dL — AB (ref 0.57–1.00)
Chloride: 105 mmol/L (ref 96–106)
GFR calc Af Amer: 142 mL/min/{1.73_m2} (ref >59)
GFR, EST NON AFRICAN AMERICAN: 123 mL/min/{1.73_m2} (ref >59)
GLOBULIN, TOTAL: 2.5 g/dL (ref 1.5–4.5)
GLUCOSE: 134 mg/dL — AB (ref 65–99)
Potassium: 3.8 mmol/L (ref 3.5–5.2)
Sodium: 138 mmol/L (ref 134–144)
Total Protein: 6.4 g/dL (ref 6.0–8.5)

## 2017-10-20 LAB — PROTEIN / CREATININE RATIO, URINE
Creatinine, Urine: 349.9 mg/dL
PROTEIN UR: 32 mg/dL
PROTEIN/CREAT RATIO: 91 mg/g{creat} (ref 0–200)

## 2017-10-20 LAB — CBC
HEMATOCRIT: 38.7 % (ref 34.0–46.6)
Hemoglobin: 13.2 g/dL (ref 11.1–15.9)
MCH: 29.4 pg (ref 26.6–33.0)
MCHC: 34.1 g/dL (ref 31.5–35.7)
MCV: 86 fL (ref 79–97)
PLATELETS: 254 10*3/uL (ref 150–450)
RBC: 4.49 x10E6/uL (ref 3.77–5.28)
RDW: 16.9 % — AB (ref 12.3–15.4)
WBC: 6.7 10*3/uL (ref 3.4–10.8)

## 2017-10-21 LAB — AFP, SERUM, OPEN SPINA BIFIDA
AFP MoM: 0.61
AFP Value: 15.6 ng/mL
GEST. AGE ON COLLECTION DATE: 15.3 wk
MATERNAL AGE AT EDD: 35.1 a
OSBR Risk 1 IN: 10000
Test Results:: NEGATIVE
WEIGHT: 225 [lb_av]

## 2017-10-25 ENCOUNTER — Encounter: Payer: Commercial Managed Care - PPO | Attending: Obstetrics and Gynecology | Admitting: Registered"

## 2017-10-25 DIAGNOSIS — O0990 Supervision of high risk pregnancy, unspecified, unspecified trimester: Secondary | ICD-10-CM | POA: Diagnosis not present

## 2017-10-25 DIAGNOSIS — O99810 Abnormal glucose complicating pregnancy: Secondary | ICD-10-CM

## 2017-10-25 DIAGNOSIS — Z3A00 Weeks of gestation of pregnancy not specified: Secondary | ICD-10-CM | POA: Insufficient documentation

## 2017-10-25 DIAGNOSIS — Z713 Dietary counseling and surveillance: Secondary | ICD-10-CM | POA: Insufficient documentation

## 2017-10-25 NOTE — Progress Notes (Signed)
Patient was seen on 10/25/2017 for Gestational Diabetes self-management class at [facility]. The following learning objectives were met by the patient during this course:   States the definition of Gestational Diabetes  States why dietary management is important in controlling blood glucose  Describes the effects each nutrient has on blood glucose levels  Demonstrates ability to create a balanced meal plan  Demonstrates carbohydrate counting   States when to check blood glucose levels  Demonstrates proper blood glucose monitoring techniques  States the effect of stress and exercise on blood glucose levels  States the importance of limiting caffeine and abstaining from alcohol and smoking  Blood glucose monitor given: none  Patient instructed to monitor glucose levels: FBS: 60 - <95; 1 hour: <140; 2 hour: <120  Patient received handouts:  Nutrition Diabetes and Pregnancy, including carb counting list  Patient will be seen for follow-up as needed.

## 2017-10-27 ENCOUNTER — Encounter: Payer: Self-pay | Admitting: Registered"

## 2017-10-27 DIAGNOSIS — O99810 Abnormal glucose complicating pregnancy: Secondary | ICD-10-CM | POA: Insufficient documentation

## 2017-11-01 ENCOUNTER — Telehealth: Payer: Self-pay

## 2017-11-01 NOTE — Telephone Encounter (Signed)
Received call from Guardian "Olegario MessierKathy," states patient has been out of work since 09/26/17. Informed Olegario MessierKathy we wrote her a generic pregnancy restrictions letter but did not take her out of work.  TC to patient asking why hasn't she been to work. Pt states her job could not accommodate the restriction so they told her they would have to let her go. Pt aware since the doctor did not take her out of work, we can not approve the days she missed. Pt will contact her employer and Olegario MessierKathy.

## 2017-11-13 ENCOUNTER — Encounter (HOSPITAL_COMMUNITY): Payer: Self-pay

## 2017-11-13 ENCOUNTER — Ambulatory Visit (HOSPITAL_COMMUNITY)
Admission: RE | Admit: 2017-11-13 | Discharge: 2017-11-13 | Disposition: A | Discharge status: Home / Self Care | Payer: Medicaid Other | Source: Ambulatory Visit | Attending: Obstetrics and Gynecology | Admitting: Obstetrics and Gynecology

## 2017-11-13 DIAGNOSIS — Z369 Encounter for antenatal screening, unspecified: Secondary | ICD-10-CM | POA: Diagnosis not present

## 2017-11-13 DIAGNOSIS — O0990 Supervision of high risk pregnancy, unspecified, unspecified trimester: Secondary | ICD-10-CM

## 2017-11-13 DIAGNOSIS — Z3689 Encounter for other specified antenatal screening: Secondary | ICD-10-CM | POA: Diagnosis present

## 2017-11-13 DIAGNOSIS — O26892 Other specified pregnancy related conditions, second trimester: Secondary | ICD-10-CM | POA: Insufficient documentation

## 2017-11-13 DIAGNOSIS — Z3A19 19 weeks gestation of pregnancy: Secondary | ICD-10-CM | POA: Insufficient documentation

## 2017-11-13 DIAGNOSIS — O24410 Gestational diabetes mellitus in pregnancy, diet controlled: Secondary | ICD-10-CM | POA: Insufficient documentation

## 2017-11-13 DIAGNOSIS — O0992 Supervision of high risk pregnancy, unspecified, second trimester: Secondary | ICD-10-CM | POA: Diagnosis not present

## 2017-11-13 DIAGNOSIS — O09292 Supervision of pregnancy with other poor reproductive or obstetric history, second trimester: Secondary | ICD-10-CM | POA: Diagnosis not present

## 2017-11-14 ENCOUNTER — Other Ambulatory Visit (HOSPITAL_COMMUNITY): Payer: Self-pay | Admitting: *Deleted

## 2017-11-14 DIAGNOSIS — O24410 Gestational diabetes mellitus in pregnancy, diet controlled: Secondary | ICD-10-CM

## 2017-11-16 ENCOUNTER — Encounter: Payer: Self-pay | Admitting: Obstetrics and Gynecology

## 2017-11-16 ENCOUNTER — Ambulatory Visit (INDEPENDENT_AMBULATORY_CARE_PROVIDER_SITE_OTHER): Payer: Commercial Managed Care - PPO | Admitting: Obstetrics and Gynecology

## 2017-11-16 VITALS — BP 118/82 | HR 108 | Wt 229.0 lb

## 2017-11-16 DIAGNOSIS — O0990 Supervision of high risk pregnancy, unspecified, unspecified trimester: Secondary | ICD-10-CM

## 2017-11-16 DIAGNOSIS — O26899 Other specified pregnancy related conditions, unspecified trimester: Secondary | ICD-10-CM

## 2017-11-16 DIAGNOSIS — O24410 Gestational diabetes mellitus in pregnancy, diet controlled: Secondary | ICD-10-CM

## 2017-11-16 DIAGNOSIS — Z6791 Unspecified blood type, Rh negative: Secondary | ICD-10-CM

## 2017-11-16 NOTE — Progress Notes (Signed)
   PRENATAL VISIT NOTE  Subjective:  Erby Piananika R Culp is a 37 y.o. [id number] at 10854w3d being seen today for ongoing prenatal care.  She is currently monitored for the following issues for this high-risk pregnancy and has Supervision of high risk pregnancy, antepartum; Rh negative state in antepartum period; Low vitamin D level; Elevated hemoglobin A1c; Gestational diabetes; and Abnormal glucose tolerance test (GTT) during pregnancy, antepartum on their problem list.  Patient reports no complaints.  Contractions: Not present. Vag. Bleeding: None.  Movement: Present. Denies leaking of fluid.   The following portions of the patient's history were reviewed and updated as appropriate: allergies, current medications, past family history, past medical history, past social history, past surgical history and problem list. Problem list updated.  Objective:   Vitals:   11/16/17 1012  BP: 118/82  Pulse: (!) 108  Weight: 229 lb (103.9 kg)    Fetal Status: Fetal Heart Rate (bpm): 160   Movement: Present     General:  Alert, oriented and cooperative. Patient is in no acute distress.  Skin: Skin is warm and dry. No rash noted.   Cardiovascular: Normal heart rate noted  Respiratory: Normal respiratory effort, no problems with respiration noted  Abdomen: Soft, gravid, appropriate for gestational age.  Pain/Pressure: Absent     Pelvic: Cervical exam deferred        Extremities: Normal range of motion.     Mental Status: Normal mood and affect. Normal behavior. Normal judgment and thought content.   Assessment and Plan:  Pregnancy: [id number] at 8654w3d  1. Supervision of high risk pregnancy, antepartum Patient is doing well without complaints  2. Diet controlled gestational diabetes mellitus (GDM) in second trimester Patient is not bring CBG log or meter but reports highest fasting 92 and highest pp 96 Follow up growth ultrasound Fetal echo on 9/26  3. Rh negative state in antepartum period Rhogam  at 28 weeks  Preterm labor symptoms and general obstetric precautions including but not limited to vaginal bleeding, contractions, leaking of fluid and fetal movement were reviewed in detail with the patient. Please refer to After Visit Summary for other counseling recommendations.  Return in about 4 weeks (around 12/14/2017) for ROB.  Future Appointments  Date Time Provider Department Center  12/11/2017  2:45 PM [facility] US 2 [facility]    Catalina AntiguaPeggy Brysten Reister, MD

## 2017-11-21 DIAGNOSIS — Z3481 Encounter for supervision of other normal pregnancy, first trimester: Secondary | ICD-10-CM

## 2017-12-11 ENCOUNTER — Encounter (HOSPITAL_COMMUNITY): Payer: Self-pay

## 2017-12-11 ENCOUNTER — Ambulatory Visit (HOSPITAL_COMMUNITY)
Admission: RE | Admit: 2017-12-11 | Discharge: 2017-12-11 | Disposition: A | Discharge status: Home / Self Care | Payer: Commercial Managed Care - PPO | Source: Ambulatory Visit | Attending: Obstetrics and Gynecology | Admitting: Obstetrics and Gynecology

## 2017-12-11 DIAGNOSIS — O26892 Other specified pregnancy related conditions, second trimester: Secondary | ICD-10-CM | POA: Insufficient documentation

## 2017-12-11 DIAGNOSIS — Z6791 Unspecified blood type, Rh negative: Secondary | ICD-10-CM | POA: Diagnosis not present

## 2017-12-11 DIAGNOSIS — O24410 Gestational diabetes mellitus in pregnancy, diet controlled: Secondary | ICD-10-CM

## 2017-12-11 DIAGNOSIS — O09292 Supervision of pregnancy with other poor reproductive or obstetric history, second trimester: Secondary | ICD-10-CM

## 2017-12-11 DIAGNOSIS — Z3A23 23 weeks gestation of pregnancy: Secondary | ICD-10-CM

## 2017-12-11 DIAGNOSIS — Z362 Encounter for other antenatal screening follow-up: Secondary | ICD-10-CM | POA: Diagnosis not present

## 2017-12-12 ENCOUNTER — Other Ambulatory Visit (HOSPITAL_COMMUNITY): Payer: Self-pay | Admitting: *Deleted

## 2017-12-12 DIAGNOSIS — O24410 Gestational diabetes mellitus in pregnancy, diet controlled: Secondary | ICD-10-CM

## 2017-12-14 ENCOUNTER — Ambulatory Visit (INDEPENDENT_AMBULATORY_CARE_PROVIDER_SITE_OTHER): Payer: Commercial Managed Care - PPO | Admitting: Obstetrics and Gynecology

## 2017-12-14 ENCOUNTER — Encounter: Payer: Self-pay | Admitting: Obstetrics and Gynecology

## 2017-12-14 VITALS — BP 144/90 | HR 96 | Wt 234.0 lb

## 2017-12-14 DIAGNOSIS — O09292 Supervision of pregnancy with other poor reproductive or obstetric history, second trimester: Secondary | ICD-10-CM

## 2017-12-14 DIAGNOSIS — O24410 Gestational diabetes mellitus in pregnancy, diet controlled: Secondary | ICD-10-CM

## 2017-12-14 DIAGNOSIS — Z8751 Personal history of pre-term labor: Secondary | ICD-10-CM

## 2017-12-14 DIAGNOSIS — O09299 Supervision of pregnancy with other poor reproductive or obstetric history, unspecified trimester: Secondary | ICD-10-CM

## 2017-12-14 DIAGNOSIS — Z6791 Unspecified blood type, Rh negative: Secondary | ICD-10-CM

## 2017-12-14 DIAGNOSIS — O26899 Other specified pregnancy related conditions, unspecified trimester: Secondary | ICD-10-CM

## 2017-12-14 DIAGNOSIS — O26892 Other specified pregnancy related conditions, second trimester: Secondary | ICD-10-CM

## 2017-12-14 DIAGNOSIS — O0992 Supervision of high risk pregnancy, unspecified, second trimester: Secondary | ICD-10-CM

## 2017-12-14 DIAGNOSIS — O0990 Supervision of high risk pregnancy, unspecified, unspecified trimester: Secondary | ICD-10-CM

## 2017-12-14 MED ORDER — GLUCOSE BLOOD VI STRP: ORAL_STRIP | 3 refills | Status: DC

## 2017-12-14 NOTE — Progress Notes (Signed)
Pt did not bring blood sugars. Pt states highest reading was 99. Pt states she does not like the device and stripes she uses to check sugars.  Pt wants different device.  Want to discuss induction   C/O: constipation

## 2017-12-14 NOTE — Patient Instructions (Addendum)
Etonogestrel implant What is this medicine? ETONOGESTREL (et oh noe JES trel) is a contraceptive (birth control) device. It is used to prevent pregnancy. It can be used for up to 3 years. This medicine may be used for other purposes; ask your health care provider or pharmacist if you have questions. COMMON BRAND NAME(S): Implanon, Nexplanon What should I tell my health care provider before I take this medicine? They need to know if you have any of these conditions: -abnormal vaginal bleeding -blood vessel disease or blood clots -cancer of the breast, cervix, or liver -depression -diabetes -gallbladder disease -headaches -heart disease or recent heart attack -high blood pressure -high cholesterol -kidney disease -liver disease -renal disease -seizures -tobacco smoker -an unusual or allergic reaction to etonogestrel, other hormones, anesthetics or antiseptics, medicines, foods, dyes, or preservatives -pregnant or trying to get pregnant -breast-feeding How should I use this medicine? This device is inserted just under the skin on the inner side of your upper arm by a health care professional. Talk to your pediatrician regarding the use of this medicine in children. Special care may be needed. Overdosage: If you think you have taken too much of this medicine contact a poison control center or emergency room at once. NOTE: This medicine is only for you. Do not share this medicine with others. What if I miss a dose? This does not apply. What may interact with this medicine? Do not take this medicine with any of the following medications: -amprenavir -bosentan -fosamprenavir This medicine may also interact with the following medications: -barbiturate medicines for inducing sleep or treating seizures -certain medicines for fungal infections like ketoconazole and itraconazole -grapefruit juice -griseofulvin -medicines to treat seizures like carbamazepine, felbamate, oxcarbazepine,  phenytoin, topiramate -modafinil -phenylbutazone -rifampin -rufinamide -some medicines to treat HIV infection like atazanavir, indinavir, lopinavir, nelfinavir, tipranavir, ritonavir -St. John's wort This list may not describe all possible interactions. Give your health care provider a list of all the medicines, herbs, non-prescription drugs, or dietary supplements you use. Also tell them if you smoke, drink alcohol, or use illegal drugs. Some items may interact with your medicine. What should I watch for while using this medicine? This product does not protect you against HIV infection (AIDS) or other sexually transmitted diseases. You should be able to feel the implant by pressing your fingertips over the skin where it was inserted. Contact your doctor if you cannot feel the implant, and use a non-hormonal birth control method (such as condoms) until your doctor confirms that the implant is in place. If you feel that the implant may have broken or become bent while in your arm, contact your healthcare provider. What side effects may I notice from receiving this medicine? Side effects that you should report to your doctor or health care professional as soon as possible: -allergic reactions like skin rash, itching or hives, swelling of the face, lips, or tongue -breast lumps -changes in emotions or moods -depressed mood -heavy or prolonged menstrual bleeding -pain, irritation, swelling, or bruising at the insertion site -scar at site of insertion -signs of infection at the insertion site such as fever, and skin redness, pain or discharge -signs of pregnancy -signs and symptoms of a blood clot such as breathing problems; changes in vision; chest pain; severe, sudden headache; pain, swelling, warmth in the leg; trouble speaking; sudden numbness or weakness of the face, arm or leg -signs and symptoms of liver injury like dark yellow or brown urine; general ill feeling or flu-like symptoms;  light-colored   stools; loss of appetite; nausea; right upper belly pain; unusually weak or tired; yellowing of the eyes or skin -unusual vaginal bleeding, discharge -signs and symptoms of a stroke like changes in vision; confusion; trouble speaking or understanding; severe headaches; sudden numbness or weakness of the face, arm or leg; trouble walking; dizziness; loss of balance or coordination Side effects that usually do not require medical attention (report to your doctor or health care professional if they continue or are bothersome): -acne -back pain -breast pain -changes in weight -dizziness -general ill feeling or flu-like symptoms -headache -irregular menstrual bleeding -nausea -sore throat -vaginal irritation or inflammation This list may not describe all possible side effects. Call your doctor for medical advice about side effects. You may report side effects to FDA at 1-800-FDA-1088. Where should I keep my medicine? This drug is given in a hospital or clinic and will not be stored at home. NOTE: This sheet is a summary. It may not cover all possible information. If you have questions about this medicine, talk to your doctor, pharmacist, or health care provider.  2018 Elsevier/Gold Standard (2015-09-10 11:19:22)  Intrauterine Device Information An intrauterine device (IUD) is inserted into your uterus to prevent pregnancy. There are two types of IUDs available:  Copper IUD-This type of IUD is wrapped in copper wire and is placed inside the uterus. Copper makes the uterus and fallopian tubes produce a fluid that kills sperm. The copper IUD can stay in place for 10 years.  Hormone IUD-This type of IUD contains the hormone progestin (synthetic progesterone). The hormone thickens the cervical mucus and prevents sperm from entering the uterus. It also thins the uterine lining to prevent implantation of a fertilized egg. The hormone can weaken or kill the sperm that get into the uterus.  One type of hormone IUD can stay in place for 5 years, and another type can stay in place for 3 years.  Your health care provider will make sure you are a good candidate for a contraceptive IUD. Discuss with your health care provider the possible side effects. Advantages of an intrauterine device  IUDs are highly effective, reversible, long acting, and low maintenance.  There are no estrogen-related side effects.  An IUD can be used when breastfeeding.  IUDs are not associated with weight gain.  The copper IUD works immediately after insertion.  The hormone IUD works right away if inserted within 7 days of your period starting. You will need to use a backup method of birth control for 7 days if the hormone IUD is inserted at any other time in your cycle.  The copper IUD does not interfere with your female hormones.  The hormone IUD can make heavy menstrual periods lighter and decrease cramping.  The hormone IUD can be used for 3 or 5 years.  The copper IUD can be used for 10 years. Disadvantages of an intrauterine device  The hormone IUD can be associated with irregular bleeding patterns.  The copper IUD can make your menstrual flow heavier and more painful.  You may experience cramping and vaginal bleeding after insertion. This information is not intended to replace advice given to you by your health care provider. Make sure you discuss any questions you have with your health care provider. Document Released: 01/26/2004 Document Revised: 07/30/2015 Document Reviewed: 08/12/2012 Elsevier Interactive Patient Education  2017 Elsevier Inc.  High-Fiber Diet Fiber, also called dietary fiber, is a type of carbohydrate found in fruits, vegetables, whole grains, and beans. A high-fiber diet  can have many health benefits. Your health care provider may recommend a high-fiber diet to help:  Prevent constipation. Fiber can make your bowel movements more regular.  Lower your  cholesterol.  Relieve hemorrhoids, uncomplicated diverticulosis, or irritable bowel syndrome.  Prevent overeating as part of a weight-loss plan.  Prevent heart disease, type 2 diabetes, and certain cancers.  What is my plan? The recommended daily intake of fiber includes:  38 grams for men under age 62.  30 grams for men over age 10.  25 grams for women under age 41.  21 grams for women over age 20.  You can get the recommended daily intake of dietary fiber by eating a variety of fruits, vegetables, grains, and beans. Your health care provider may also recommend a fiber supplement if it is not possible to get enough fiber through your diet. What do I need to know about a high-fiber diet?  Fiber supplements have not been widely studied for their effectiveness, so it is better to get fiber through food sources.  Always check the fiber content on thenutrition facts label of any prepackaged food. Look for foods that contain at least 5 grams of fiber per serving.  Ask your dietitian if you have questions about specific foods that are related to your condition, especially if those foods are not listed in the following section.  Increase your daily fiber consumption gradually. Increasing your intake of dietary fiber too quickly may cause bloating, cramping, or gas.  Drink plenty of water. Water helps you to digest fiber. What foods can I eat? Grains Whole-grain breads. Multigrain cereal. Oats and oatmeal. Brown rice. Barley. Bulgur wheat. Millet. Bran muffins. Popcorn. Rye wafer crackers. Vegetables Sweet potatoes. Spinach. Kale. Artichokes. Cabbage. Broccoli. Green peas. Carrots. Squash. Fruits Berries. Pears. Apples. Oranges. Avocados. Prunes and raisins. Dried figs. Meats and Other Protein Sources Navy, kidney, pinto, and soy beans. Split peas. Lentils. Nuts and seeds. Dairy Fiber-fortified yogurt. Beverages Fiber-fortified soy milk. Fiber-fortified orange juice. Other Fiber  bars. The items listed above may not be a complete list of recommended foods or beverages. Contact your dietitian for more options. What foods are not recommended? Grains White bread. Pasta made with refined flour. White rice. Vegetables Fried potatoes. Canned vegetables. Well-cooked vegetables. Fruits Fruit juice. Cooked, strained fruit. Meats and Other Protein Sources Fatty cuts of meat. Fried Environmental education officer or fried fish. Dairy Milk. Yogurt. Cream cheese. Sour cream. Beverages Soft drinks. Other Cakes and pastries. Butter and oils. The items listed above may not be a complete list of foods and beverages to avoid. Contact your dietitian for more information. What are some tips for including high-fiber foods in my diet?  Eat a wide variety of high-fiber foods.  Make sure that half of all grains consumed each day are whole grains.  Replace breads and cereals made from refined flour or white flour with whole-grain breads and cereals.  Replace white rice with brown rice, bulgur wheat, or millet.  Start the day with a breakfast that is high in fiber, such as a cereal that contains at least 5 grams of fiber per serving.  Use beans in place of meat in soups, salads, or pasta.  Eat high-fiber snacks, such as berries, raw vegetables, nuts, or popcorn. This information is not intended to replace advice given to you by your health care provider. Make sure you discuss any questions you have with your health care provider. Document Released: 02/21/2005 Document Revised: 07/30/2015 Document Reviewed: 08/06/2013 Elsevier Interactive Patient Education  2018 Elsevier Inc.  

## 2017-12-14 NOTE — Progress Notes (Signed)
   PRENATAL VISIT NOTE  Subjective:  [name] is a 37 y.o. [id number] at [redacted]w[redacted]d being seen today for ongoing prenatal care.  She is currently monitored for the following issues for this high-risk pregnancy and has Supervision of high risk pregnancy, antepartum; History of preterm delivery; Rh negative state in antepartum period; Low vitamin D level; Elevated hemoglobin A1c; Gestational diabetes; Abnormal glucose tolerance test (GTT) during pregnancy, antepartum; and Prior pregnancy with congenital cardiac defect, antepartum on their problem list.  Patient reports no complaints.  Contractions: Not present. Vag. Bleeding: None.  Movement: Present. Denies leaking of fluid.   The following portions of the patient's history were reviewed and updated as appropriate: allergies, current medications, past family history, past medical history, past social history, past surgical history and problem list. Problem list updated.  Objective:   Vitals:   12/14/17 1041  BP: (!) 144/90  Pulse: 96  Weight: 234 lb (106.1 kg)   Fetal Status: Fetal Heart Rate (bpm): 152   Movement: Present     General:  Alert, oriented and cooperative. Patient is in no acute distress.  Skin: Skin is warm and dry. No rash noted.   Cardiovascular: Normal heart rate noted  Respiratory: Normal respiratory effort, no problems with respiration noted  Abdomen: Soft, gravid, appropriate for gestational age.  Pain/Pressure: Absent     Pelvic: Cervical exam deferred        Extremities: Normal range of motion.  Edema: None  Mental Status: Normal mood and affect. Normal behavior. Normal judgment and thought content.   Assessment and Plan:  Pregnancy: [id number] at [redacted]w[redacted]d  1. Supervision of high risk pregnancy, antepartum  2. Rh negative state in antepartum period Rho gam at 28 weeks  3. Diet controlled gestational diabetes mellitus (GDM) in second trimester FG: 86-87 PP: 93-95 CBGs well under control, encouraged her to  bring log next visit Has cut out white bread  4. History of preterm delivery Declined 17 P  5. 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 Subjectively decreased amniotic fluid MVP 3 cm last [place], plan for repeat 3 weeks   Preterm labor symptoms and general obstetric precautions including but not limited to vaginal bleeding, contractions, leaking of fluid and fetal movement were reviewed in detail with the patient. Please refer to After Visit Summary for other counseling recommendations.  Return in about 2 weeks (around 12/28/2017) for OB visit (MD).  Future Appointments  Date Time Provider Department Center  01/01/2018  2:30 PM [facility] [place] 1 [facility]    Conan Bowens, MD

## 2017-12-21 ENCOUNTER — Telehealth: Payer: Self-pay

## 2017-12-21 NOTE — Telephone Encounter (Signed)
TC from pt w/ complaints of hemorrhoid sx's Pt requesting Rx  TC to pt to make aware of OTC medications and at next appt ask for tucks pads Pt not ava lvom that I would still consult w/ provider on Rx request.

## 2017-12-22 ENCOUNTER — Other Ambulatory Visit: Payer: Self-pay | Admitting: Obstetrics

## 2017-12-22 DIAGNOSIS — K649 Unspecified hemorrhoids: Secondary | ICD-10-CM

## 2017-12-22 MED ORDER — HYDROCORTISONE ACETATE 25 MG RE SUPP: 25.0000 mg | Freq: Two times a day (BID) | RECTAL | 11 refills | Status: DC

## 2017-12-22 NOTE — Telephone Encounter (Signed)
[facility] Rx

## 2017-12-28 ENCOUNTER — Ambulatory Visit (INDEPENDENT_AMBULATORY_CARE_PROVIDER_SITE_OTHER): Payer: Commercial Managed Care - PPO | Admitting: Obstetrics & Gynecology

## 2017-12-28 VITALS — BP 127/82 | HR 105 | Wt 235.3 lb

## 2017-12-28 DIAGNOSIS — O26899 Other specified pregnancy related conditions, unspecified trimester: Secondary | ICD-10-CM

## 2017-12-28 DIAGNOSIS — O24410 Gestational diabetes mellitus in pregnancy, diet controlled: Secondary | ICD-10-CM

## 2017-12-28 DIAGNOSIS — O0990 Supervision of high risk pregnancy, unspecified, unspecified trimester: Secondary | ICD-10-CM

## 2017-12-28 DIAGNOSIS — Z6791 Unspecified blood type, Rh negative: Secondary | ICD-10-CM

## 2017-12-28 NOTE — Patient Instructions (Signed)
Return to clinic for any scheduled appointments or obstetric concerns, or go to [facility]U for evaluation  TDaP Vaccine Pregnancy Get the Whooping Cough Vaccine While You Are Pregnant (CDC)  It is important for women to get the whooping cough vaccine in the third trimester of each pregnancy. Vaccines are the best way to prevent this disease. There are 2 different whooping cough vaccines. Both vaccines combine protection against whooping cough, tetanus and diphtheria, but they are for different age groups: Tdap: for everyone 11 years or older, including pregnant women  DTaP: for children 2 months through 6 years of age  You need the whooping cough vaccine during each of your pregnancies The recommended time to get the shot is during your 27th through 36th week of pregnancy, preferably during the earlier part of this time period. The Centers for Disease Control and Prevention (CDC) recommends that pregnant women receive the whooping cough vaccine for adolescents and adults (called Tdap vaccine) during the third trimester of each pregnancy. The recommended time to get the shot is during your 27th through 36th week of pregnancy, preferably during the earlier part of this time period. This replaces the original recommendation that pregnant women get the vaccine only if they had not previously received it. The American College of Obstetricians and Gynecologists and the American College of Nurse-Midwives support this recommendation.  You should get the whooping cough vaccine while pregnant to pass protection to your baby frame support disabled and/or not supported in this browser  Learn why Laura decided to get the whooping cough vaccine in her 3rd trimester of pregnancy and how her baby girl was born with some protection against the disease. Also available on YouTube. After receiving the whooping cough vaccine, your body will create protective antibodies (proteins produced by the body to fight off diseases) and  pass some of them to your baby before birth. These antibodies provide your baby some short-term protection against whooping cough in early life. These antibodies can also protect your baby from some of the more serious complications that come along with whooping cough. Your protective antibodies are at their highest about 2 weeks after getting the vaccine, but it takes time to pass them to your baby. So the preferred time to get the whooping cough vaccine is early in your third trimester. The amount of whooping cough antibodies in your body decreases over time. That is why CDC recommends you get a whooping cough vaccine during each pregnancy. Doing so allows each of your babies to get the greatest number of protective antibodies from you. This means each of your babies will get the best protection possible against this disease.  Getting the whooping cough vaccine while pregnant is better than getting the vaccine after you give birth Whooping cough vaccination during pregnancy is ideal so your baby will have short-term protection as soon as he is born. This early protection is important because your baby will not start getting his whooping cough vaccines until he is 2 months old. These first few months of life are when your baby is at greatest risk for catching whooping cough. This is also when he's at greatest risk for having severe, potentially life-threating complications from the infection. To avoid that gap in protection, it is best to get a whooping cough vaccine during pregnancy. You will then pass protection to your baby before he is born. To continue protecting your baby, he should get whooping cough vaccines starting at 2 months old. You may never have gotten the   Tdap vaccine before and did not get it during this pregnancy. If so, you should make sure to get the vaccine immediately after you give birth, before leaving the hospital or birthing center. It will take about 2 weeks before your body  develops protection (antibodies) in response to the vaccine. Once you have protection from the vaccine, you are less likely to give whooping cough to your newborn while caring for him. But remember, your baby will still be at risk for catching whooping cough from others. A recent study looked to see how effective Tdap was at preventing whooping cough in babies whose mothers got the vaccine while pregnant or in the hospital after giving birth. The study found that getting Tdap between 27 through 36 weeks of pregnancy is 85% more effective at preventing whooping cough in babies younger than 2 months old. Blood tests cannot tell if you need a whooping cough vaccine There are no blood tests that can tell you if you have enough antibodies in your body to protect yourself or your baby against whooping cough. Even if you have been sick with whooping cough in the past or previously received the vaccine, you still should get the vaccine during each pregnancy. Breastfeeding may pass some protective antibodies onto your baby By breastfeeding, you may pass some antibodies you have made in response to the vaccine to your baby. When you get a whooping cough vaccine during your pregnancy, you will have antibodies in your breast milk that you can share with your baby as soon as your milk comes in. However, your baby will not get protective antibodies immediately if you wait to get the whooping cough vaccine until after delivering your baby. This is because it takes about 2 weeks for your body to create antibodies. Learn more about the health benefits of breastfeeding. 

## 2017-12-28 NOTE — Progress Notes (Signed)
PRENATAL VISIT NOTE  Subjective:  [name] is a 37 y.o. [id number] at [redacted]w[redacted]d being seen today for ongoing prenatal care.  She is currently monitored for the following issues for this high-risk pregnancy and has Supervision of high risk pregnancy, antepartum; History of preterm delivery; Rh negative state in antepartum period; Low vitamin D level; Elevated hemoglobin A1c; Gestational diabetes; Abnormal glucose tolerance test (GTT) during pregnancy, antepartum; and Prior pregnancy with congenital cardiac defect, antepartum on their problem list.  Patient reports no complaints. Reports normal BS for several weeks, did not check last week. Also stopped taking ASA, wants to know why she needs to take it.  Contractions: Not present. Vag. Bleeding: None.  Movement: Present. Denies leaking of fluid.   The following portions of the patient's history were reviewed and updated as appropriate: allergies, current medications, past family history, past medical history, past social history, past surgical history and problem list. Problem list updated.  Objective:   Vitals:   12/28/17 1042  BP: 127/82  Pulse: (!) 105  Weight: 235 lb 4.8 oz (106.7 kg)    Fetal Status: Fetal Heart Rate (bpm): 156 Fundal Height: 26 cm Movement: Present     General:  Alert, oriented and cooperative. Patient is in no acute distress.  Skin: Skin is warm and dry. No rash noted.   Cardiovascular: Normal heart rate noted  Respiratory: Normal respiratory effort, no problems with respiration noted  Abdomen: Soft, gravid, appropriate for gestational age.  Pain/Pressure: Absent     Pelvic: Cervical exam deferred        Extremities: Normal range of motion.  Edema: None  Mental Status: Normal mood and affect. Normal behavior. Normal judgment and thought content.   [place] [facility] Ob Follow Up  Result Date: 12/11/2017 ----------------------------------------------------------------------  OBSTETRICS REPORT                        (Signed Final 12/11/2017 04:25 pm) ---------------------------------------------------------------------- Patient Info  ID #:       [id number]                          D.O.B.:  12-Jan-1981 (36 yrs)  Name:       [name]                 Visit Date: 12/11/2017 03:23 pm ---------------------------------------------------------------------- Performed By  Performed By:     Eden Lathe BS      Ref. Address:     Faculty                    RDMS RVT  Attending:        Noralee Space MD        Location:         [facility]  Referred By:      Catalina Antigua MD ---------------------------------------------------------------------- Orders   #  Description                          Code         Ordered By   1  [place] [facility] OB FOLLOW UP                  62130.86     RAVI SHANKAR  ----------------------------------------------------------------------   #  Order #                    Accession #                 Episode #   1  [id number]                  [id number]                  [id number]  ---------------------------------------------------------------------- Indications   Gestational diabetes in pregnancy, diet        O24.410   controlled   Poor obstetric history: Previous IUFD 30       O09.299   weeks   Previous pregnacy with congenital heart        O09.299   (cardiac) defect- [facility]   Rh negative state in antepartum                O36.0190   NIPS low risk; AFP screen negative   [redacted] weeks gestation of pregnancy                Z3A.23   Antenatal follow-up for nonvisualized fetal    Z36.2   anatomy  ---------------------------------------------------------------------- Fetal Evaluation  Num Of Fetuses:         1  Fetal Heart Rate(bpm):  153  Cardiac Activity:       Observed  Presentation:           Cephalic  Placenta:               Posterior  P. Cord Insertion:      Previously Visualized  Amniotic Fluid  AFI FV:      Subjectively low-normal                              Largest Pocket(cm)                               2.5 ---------------------------------------------------------------------- Biometry  BPD:      50.5  mm     G. Age:  21w 2d          3  %    CI:        71.29   %    70 - 86                                                          FL/HC:      21.0   %    19.2 - 20.8  HC:      190.5  mm     G. Age:  21w 2d        < 3  %    HC/AC:      1.05        1.05 - 1.21  AC:      182.2  mm     G. Age:  23w 0d         44  %    FL/BPD:     79.2   %    71 - 87  FL:         40  mm  G. Age:  22w 6d         36  %    FL/AC:      22.0   %    20 - 24  HUM:      37.5  mm     G. Age:  23w 1d         46  %  Est. FW:     531  gm      1 lb 3 oz     50  % ---------------------------------------------------------------------- OB History  Gravidity:    15        Term:   2        Prem:   2        SAB:   1  TOP:          9       Ectopic:  0        Living: 3 ---------------------------------------------------------------------- Gestational Age  LMP:           23w 0d        Date:  07/03/17                 EDD:   04/09/18  U/S Today:     22w 1d                                        EDD:   04/15/18  Best:          23w 0d     Det. By:  LMP  (07/03/17)          EDD:   04/09/18 ---------------------------------------------------------------------- Anatomy  Cranium:               Appears normal         Aortic Arch:            Not well visualized  Cavum:                 Previously seen        Ductal Arch:            Not well visualized  Ventricles:            Previously seen        Diaphragm:              Appears normal  Choroid Plexus:        Previously seen        Stomach:                Appears normal, left                                                                        sided  Cerebellum:            Previously seen        Abdomen:                Appears normal  Posterior Fossa:       Previously seen        Abdominal Wall:  Appears nml (cord                                                                        insert, abd wall)  Nuchal  Fold:           Previously seen        Cord Vessels:           Appears normal (3                                                                        vessel cord)  Face:                  Appears normal         Kidneys:                Appear normal                         (orbits and profile)  Lips:                  Appears normal         Bladder:                Appears normal  Thoracic:              Appears normal         Spine:                  Previously seen  Heart:                 Appears normal         Upper Extremities:      Previously seen                         (4CH, axis, and situs  RVOT:                  Appears normal         Lower Extremities:      Previously seen  LVOT:                  Appears normal  Other:  Nasal bone visualized. Heels previously visualized. Open hand          previously visualized. Technically difficult due to fetal position. ---------------------------------------------------------------------- Cervix Uterus Adnexa  Cervix  Length:           3.18  cm.  Normal appearance by transabdominal scan.  Uterus  No abnormality visualized.  Left Ovary  Not visualized.  Right Ovary  Not visualized.  Cul De Sac  No free fluid seen.  Adnexa  No abnormality visualized. ---------------------------------------------------------------------- Impression  Patient returned for completion of fetal anatomy. 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 Mayer Camel).  Patient also has  gestational diabetes that is reportedly well-  controlled on diet.  Fetal growth is appropriate for the gestational age. Head  measurements were obtained with some difficulty because of  fetal head position. Amniotic fluid is subjectively decreased  (MVP 3 cm) and good fetal activity is seen. Cardiac anatomy  appears normal.  I explained the findings and reassured her. We briefly  discussed on the importance of good control of diabetes to  prevent adverse  fetal and neonatal outcomes. ---------------------------------------------------------------------- Recommendations  -An appointment was made for her to return in 3 weeks for  fetal growth and amniotic fluid assessments. ----------------------------------------------------------------------                  Noralee Space, MD Electronically Signed Final Report   12/11/2017 04:25 pm ----------------------------------------------------------------------   Assessment and Plan:  Pregnancy: [id number] at [redacted]w[redacted]d  1. Diet controlled gestational diabetes mellitus (GDM) in second trimester Encouraged to check BS as recommended.  Discussed implications of GDM in pregnancy, need for optimizing glycemic control to decrease GDM associated maternal-fetal morbidity and mortality. Also encouraged to take ASA for preeclampsia prevention.   2. Rh negative state in antepartum period Will get Rhogam at 28 week.  3. Supervision of high risk pregnancy, antepartum Preterm labor symptoms and general obstetric precautions including but not limited to vaginal bleeding, contractions, leaking of fluid and fetal movement were reviewed in detail with the patient. Please refer to After Visit Summary for other counseling recommendations.  Return in about 2 weeks (around 01/11/2018) for OB Visit.  Future Appointments  Date Time Provider Department Center  01/01/2018  2:30 PM [facility] [place] 1 [facility]    Jaynie Collins, MD

## 2018-01-01 ENCOUNTER — Encounter (HOSPITAL_COMMUNITY): Payer: Self-pay

## 2018-01-01 ENCOUNTER — Ambulatory Visit (HOSPITAL_COMMUNITY)
Admission: RE | Admit: 2018-01-01 | Discharge: 2018-01-01 | Disposition: A | Discharge status: Home / Self Care | Payer: Commercial Managed Care - PPO | Source: Ambulatory Visit | Attending: Obstetrics and Gynecology | Admitting: Obstetrics and Gynecology

## 2018-01-02 ENCOUNTER — Ambulatory Visit (HOSPITAL_COMMUNITY)
Admission: RE | Admit: 2018-01-02 | Discharge: 2018-01-02 | Disposition: A | Discharge status: Home / Self Care | Payer: Commercial Managed Care - PPO | Source: Ambulatory Visit | Attending: Obstetrics and Gynecology | Admitting: Obstetrics and Gynecology

## 2018-01-02 ENCOUNTER — Encounter (HOSPITAL_COMMUNITY): Payer: Self-pay

## 2018-01-02 DIAGNOSIS — O360120 Maternal care for anti-D [Rh] antibodies, second trimester, not applicable or unspecified: Secondary | ICD-10-CM

## 2018-01-02 DIAGNOSIS — Z362 Encounter for other antenatal screening follow-up: Secondary | ICD-10-CM | POA: Diagnosis not present

## 2018-01-02 DIAGNOSIS — Z3A26 26 weeks gestation of pregnancy: Secondary | ICD-10-CM | POA: Insufficient documentation

## 2018-01-02 DIAGNOSIS — O09299 Supervision of pregnancy with other poor reproductive or obstetric history, unspecified trimester: Secondary | ICD-10-CM

## 2018-01-02 DIAGNOSIS — O24410 Gestational diabetes mellitus in pregnancy, diet controlled: Secondary | ICD-10-CM | POA: Insufficient documentation

## 2018-01-02 DIAGNOSIS — O09292 Supervision of pregnancy with other poor reproductive or obstetric history, second trimester: Secondary | ICD-10-CM | POA: Diagnosis not present

## 2018-01-03 ENCOUNTER — Other Ambulatory Visit (HOSPITAL_COMMUNITY): Payer: Self-pay | Admitting: *Deleted

## 2018-01-03 DIAGNOSIS — O24410 Gestational diabetes mellitus in pregnancy, diet controlled: Secondary | ICD-10-CM

## 2018-01-11 ENCOUNTER — Ambulatory Visit (INDEPENDENT_AMBULATORY_CARE_PROVIDER_SITE_OTHER): Payer: Commercial Managed Care - PPO | Admitting: Advanced Practice Midwife

## 2018-01-11 VITALS — BP 112/75 | HR 89 | Wt 238.0 lb

## 2018-01-11 DIAGNOSIS — O0992 Supervision of high risk pregnancy, unspecified, second trimester: Secondary | ICD-10-CM

## 2018-01-11 DIAGNOSIS — Z23 Encounter for immunization: Secondary | ICD-10-CM | POA: Diagnosis not present

## 2018-01-11 DIAGNOSIS — Z6791 Unspecified blood type, Rh negative: Secondary | ICD-10-CM

## 2018-01-11 DIAGNOSIS — O26892 Other specified pregnancy related conditions, second trimester: Secondary | ICD-10-CM

## 2018-01-11 DIAGNOSIS — O0990 Supervision of high risk pregnancy, unspecified, unspecified trimester: Secondary | ICD-10-CM

## 2018-01-11 DIAGNOSIS — O24410 Gestational diabetes mellitus in pregnancy, diet controlled: Secondary | ICD-10-CM

## 2018-01-11 NOTE — Patient Instructions (Signed)

## 2018-01-11 NOTE — Progress Notes (Signed)
   PRENATAL VISIT NOTE  Subjective:  [name] is a 37 y.o. [id number] at [redacted]w[redacted]d being seen today for ongoing prenatal care.  She is currently monitored for the following issues for this high-risk pregnancy and has Supervision of high risk pregnancy, antepartum; History of preterm delivery; Rh negative state in antepartum period; Low vitamin D level; Elevated hemoglobin A1c; Gestational diabetes; Abnormal glucose tolerance test (GTT) during pregnancy, antepartum; and Prior pregnancy with congenital cardiac defect, antepartum on their problem list.  Patient reports no complaints.  Contractions: Not present. Vag. Bleeding: None.  Movement: Present. Denies leaking of fluid.   The following portions of the patient's history were reviewed and updated as appropriate: allergies, current medications, past family history, past medical history, past social history, past surgical history and problem list. Problem list updated.  Objective:   Vitals:   01/11/18 1147  BP: 112/75  Pulse: 89  Weight: 238 lb (108 kg)    Fetal Status:     Movement: Present     General:  Alert, oriented and cooperative. Patient is in no acute distress.  Skin: Skin is warm and dry. No rash noted.   Cardiovascular: Normal heart rate noted  Respiratory: Normal respiratory effort, no problems with respiration noted  Abdomen: Soft, gravid, appropriate for gestational age.  Pain/Pressure: Absent     Pelvic: Cervical exam deferred        Extremities: Normal range of motion.  Edema: None  Mental Status: Normal mood and affect. Normal behavior. Normal judgment and thought content.   Assessment and Plan:  Pregnancy: [id number] at [redacted]w[redacted]d  1. Supervision of high risk pregnancy, antepartum - No complaints or concerns today - Anticipatory guidance on hemodilution in third trimester and normal changes in activity tolerance - TDAP today - CBC - HIV Antibody (routine testing w rflx) - RPR  2. Diet controlled gestational  diabetes mellitus (GDM) in second trimester - Did not bring log to appt. Reports all readings are within the guidelines provided by diabetes educator - Normal fetal growth 01/02/18 - F/u scan scheduled for 01/30/18  3. Rh negative state in antepartum period, second trimester - RN visit next week for Rhogam at 28 weeks - Rhogam postpartum inpatient  Preterm labor symptoms and general obstetric precautions including but not limited to vaginal bleeding, contractions, leaking of fluid and fetal movement were reviewed in detail with the patient. Please refer to After Visit Summary for other counseling recommendations.  Return in about 2 weeks (around 01/25/2018).  Future Appointments  Date Time Provider Department Center  01/30/2018  2:30 PM [facility] [place] 1 [facility]    Calvert Cantor, CNM

## 2018-01-11 NOTE — Progress Notes (Signed)
Pt is here for ROB. [id number] [redacted]w[redacted]d.

## 2018-01-12 LAB — CBC
HEMATOCRIT: 37.1 % (ref 34.0–46.6)
HEMOGLOBIN: 12.7 g/dL (ref 11.1–15.9)
MCH: 30.2 pg (ref 26.6–33.0)
MCHC: 34.2 g/dL (ref 31.5–35.7)
MCV: 88 fL (ref 79–97)
Platelets: 273 10*3/uL (ref 150–450)
RBC: 4.21 x10E6/uL (ref 3.77–5.28)
RDW: 14.8 % (ref 12.3–15.4)
WBC: 8.8 10*3/uL (ref 3.4–10.8)

## 2018-01-12 LAB — RPR: RPR Ser Ql: NONREACTIVE

## 2018-01-12 LAB — HIV ANTIBODY (ROUTINE TESTING W REFLEX): HIV Screen 4th Generation wRfx: NONREACTIVE

## 2018-01-18 ENCOUNTER — Other Ambulatory Visit: Payer: Self-pay

## 2018-01-18 ENCOUNTER — Inpatient Hospital Stay (HOSPITAL_COMMUNITY)
Admission: AD | Admit: 2018-01-18 | Discharge: 2018-01-18 | Disposition: A | Discharge status: Home / Self Care | Payer: Commercial Managed Care - PPO | Source: Ambulatory Visit | Attending: Obstetrics and Gynecology | Admitting: Obstetrics and Gynecology

## 2018-01-18 ENCOUNTER — Encounter (HOSPITAL_COMMUNITY): Payer: Self-pay | Admitting: *Deleted

## 2018-01-18 ENCOUNTER — Ambulatory Visit (INDEPENDENT_AMBULATORY_CARE_PROVIDER_SITE_OTHER): Payer: Commercial Managed Care - PPO

## 2018-01-18 DIAGNOSIS — O479 False labor, unspecified: Secondary | ICD-10-CM

## 2018-01-18 DIAGNOSIS — O4703 False labor before 37 completed weeks of gestation, third trimester: Secondary | ICD-10-CM | POA: Insufficient documentation

## 2018-01-18 DIAGNOSIS — Z3A28 28 weeks gestation of pregnancy: Secondary | ICD-10-CM | POA: Diagnosis not present

## 2018-01-18 DIAGNOSIS — Z87891 Personal history of nicotine dependence: Secondary | ICD-10-CM | POA: Insufficient documentation

## 2018-01-18 DIAGNOSIS — O471 False labor at or after 37 completed weeks of gestation: Secondary | ICD-10-CM | POA: Diagnosis present

## 2018-01-18 DIAGNOSIS — Z7982 Long term (current) use of aspirin: Secondary | ICD-10-CM | POA: Insufficient documentation

## 2018-01-18 DIAGNOSIS — O26892 Other specified pregnancy related conditions, second trimester: Secondary | ICD-10-CM

## 2018-01-18 DIAGNOSIS — Z6791 Unspecified blood type, Rh negative: Secondary | ICD-10-CM

## 2018-01-18 DIAGNOSIS — O0990 Supervision of high risk pregnancy, unspecified, unspecified trimester: Secondary | ICD-10-CM

## 2018-01-18 MED ORDER — RHO D IMMUNE GLOBULIN 1500 UNIT/2ML IJ SOSY
300.0000 ug | PREFILLED_SYRINGE | Freq: Once | INTRAMUSCULAR | Status: AC
  Administered 2018-01-18: 300 ug via INTRAMUSCULAR

## 2018-01-18 NOTE — MAU Provider Note (Signed)
History     CSN: [id number]  Arrival date and time: 01/18/18 1914   First Provider Initiated Contact with Patient 01/18/18 2000      Chief Complaint  Patient presents with  . Contractions   HPI   Ms.WREATHA STURGEON is a 37 y.o. female [phone] @ 57w3dhere in MAU with complaints of contractions. Says she ate some salty foods this afternoon and after that started having frequent contractions. She denies contractions now, says the contractions lasted several hours and then recently stopped.  No bleeding or leaking of fluid, + fetal movement. Rates her pain 0/10  OB History    Gravida  15   Para  4   Term  2   Preterm  2   AB  10   Living  3     SAB  1   TAB  9   Ectopic      Multiple      Live Births  3           Past Medical History:  Diagnosis Date  . Anemia     Past Surgical History:  Procedure Laterality Date  . TONSILLECTOMY  2017    Family History  Problem Relation Age of Onset  . Hyperlipidemia Mother     Social History   Tobacco Use  . Smoking status: Former Smoker    Types: Cigarettes    Last attempt to quit: 2018    Years since quitting: 1.8  . Smokeless tobacco: Never Used  Substance Use Topics  . Alcohol use: No    Comment: occ  . Drug use: No    Allergies: No Known Allergies  Medications Prior to Admission  Medication Sig Dispense Refill Last Dose  . ACCU-CHEK FASTCLIX LANCETS MISC 1 Device by Percutaneous route 4 (four) times daily. (Patient not taking: Reported on 09/12/2017) 100 each 12 Not Taking  . ACCU-CHEK FASTCLIX LANCETS MISC 1 Device by Percutaneous route 4 (four) times daily. (Patient not taking: Reported on 12/14/2017) 100 each 12 Not Taking  . aspirin 81 MG chewable tablet Chew 1 tablet (81 mg total) by mouth daily. 30 tablet 12 Taking  . Blood Glucose Monitoring Suppl (ACCU-CHEK GUIDE) w/Device KIT 1 Device by Does not apply route 4 (four) times daily. (Patient not taking: Reported on 12/14/2017) 1 kit 0 Not  Taking  . glucose blood (ACCU-CHEK GUIDE) test strip Use to check blood sugars four times a day was instructed (Patient not taking: Reported on 12/28/2017) 100 each 3 Not Taking  . hydrocortisone (ANUSOL-HC) 25 MG suppository Place 1 suppository (25 mg total) rectally 2 (two) times daily. (Patient not taking: Reported on 12/28/2017) 24 suppository 11 Not Taking  . prenatal vitamin w/FE, FA (PRENATAL 1 + 1) 27-1 MG TABS tablet Take 1 tablet by mouth daily at 12 noon. 30 each 12 Taking  . Vitamin D, Ergocalciferol, (DRISDOL) 50000 units CAPS capsule Take 1 capsule (50,000 Units total) by mouth every 7 (seven) days. 30 capsule 2 Taking   No results found for this or any previous visit (from the past 48 hour(s)).  Review of Systems  Gastrointestinal: Negative for abdominal pain.  Genitourinary: Negative for dysuria and pelvic pain.   Physical Exam   Blood pressure 138/69, pulse 89, temperature 97.8 F (36.6 C), temperature source Oral, resp. rate 20, last menstrual period 07/03/2017, SpO2 100 %.  Physical Exam  Constitutional: She is oriented to person, place, and time. She appears well-developed and well-nourished. No distress.  HENT:  Head: Normocephalic.  Eyes: Pupils are equal, round, and reactive to light.  GI: Soft. She exhibits no distension and no mass. There is no tenderness. There is no rebound and no guarding.  Genitourinary:  Genitourinary Comments: Dilation: Closed Effacement (%): Thick Exam by:: Lenna Sciara Giles Currie NP  Neurological: She is alert and oriented to person, place, and time.  Skin: Skin is warm. She is not diaphoretic.  Psychiatric: Her behavior is normal.   Fetal Tracing: Baseline: 135 bpm Variability: Moderate  Accelerations: 10x10 Decelerations: None Toco: None  MAU Course  Procedures  None  MDM  Patient rates her pain 0/10 Cervix closed.   Assessment and Plan   A:  1. Braxton Hicks contractions   2. [redacted] weeks gestation of pregnancy      P:  Discharge home in stable condition Return to MAU if symptoms worsen Preterm labor precautions Increase oral fluid intake.    Lezlie Lye, NP 01/18/2018 8:29 PM

## 2018-01-18 NOTE — Progress Notes (Signed)
I have reviewed this chart and agree with the RN/CMA assessment and management.    K. Meryl Davis, M.D. [facility]  

## 2018-01-18 NOTE — MAU Note (Signed)
Presents with c/o ctxs sincce 1730 this evening.  Denies LOF or VB.  +FM.   Reports Hx of PTD between 30-32 weeks

## 2018-01-18 NOTE — Discharge Instructions (Signed)
Braxton Hicks Contractions °Contractions of the uterus can occur throughout pregnancy, but they are not always a sign that you are in labor. You may have practice contractions called Braxton Hicks contractions. These false labor contractions are sometimes confused with true labor. °What are Braxton Hicks contractions? °Braxton Hicks contractions are tightening movements that occur in the muscles of the uterus before labor. Unlike true labor contractions, these contractions do not result in opening (dilation) and thinning of the cervix. Toward the end of pregnancy (32-34 weeks), Braxton Hicks contractions can happen more often and may become stronger. These contractions are sometimes difficult to tell apart from true labor because they can be very uncomfortable. You should not feel embarrassed if you go to the hospital with false labor. °Sometimes, the only way to tell if you are in true labor is for your health care provider to look for changes in the cervix. The health care provider will do a physical exam and may monitor your contractions. If you are not in true labor, the exam should show that your cervix is not dilating and your water has not broken. °If there are other health problems associated with your pregnancy, it is completely safe for you to be sent home with false labor. You may continue to have Braxton Hicks contractions until you go into true labor. °How to tell the difference between true labor and false labor °True labor °· Contractions last 30-70 seconds. °· Contractions become very regular. °· Discomfort is usually felt in the top of the uterus, and it spreads to the lower abdomen and low back. °· Contractions do not go away with walking. °· Contractions usually become more intense and increase in frequency. °· The cervix dilates and gets thinner. °False labor °· Contractions are usually shorter and not as strong as true labor contractions. °· Contractions are usually irregular. °· Contractions  are often felt in the front of the lower abdomen and in the groin. °· Contractions may go away when you walk around or change positions while lying down. °· Contractions get weaker and are shorter-lasting as time goes on. °· The cervix usually does not dilate or become thin. °Follow these instructions at home: °· Take over-the-counter and prescription medicines only as told by your health care provider. °· Keep up with your usual exercises and follow other instructions from your health care provider. °· Eat and drink lightly if you think you are going into labor. °· If Braxton Hicks contractions are making you uncomfortable: °? Change your position from lying down or resting to walking, or change from walking to resting. °? Sit and rest in a tub of warm water. °? Drink enough fluid to keep your urine pale yellow. Dehydration may cause these contractions. °? Do slow and deep breathing several times an hour. °· Keep all follow-up prenatal visits as told by your health care provider. This is important. °Contact a health care provider if: °· You have a fever. °· You have continuous pain in your abdomen. °Get help right away if: °· Your contractions become stronger, more regular, and closer together. °· You have fluid leaking or gushing from your vagina. °· You pass blood-tinged mucus (bloody show). °· You have bleeding from your vagina. °· You have low back pain that you never had before. °· You feel your baby’s head pushing down and causing pelvic pressure. °· Your baby is not moving inside you as much as it used to. °Summary °· Contractions that occur before labor are called Braxton   Hicks contractions, false labor, or practice contractions. °· Braxton Hicks contractions are usually shorter, weaker, farther apart, and less regular than true labor contractions. True labor contractions usually become progressively stronger and regular and they become more frequent. °· Manage discomfort from Braxton Hicks contractions by  changing position, resting in a warm bath, drinking plenty of water, or practicing deep breathing. °This information is not intended to replace advice given to you by your health care provider. Make sure you discuss any questions you have with your health care provider. °Document Released: 07/07/2016 Document Revised: 07/07/2016 Document Reviewed: 07/07/2016 °[employer] Interactive Patient Education © 2018 [employer] ° °Safe Medications in Pregnancy  ° °Acne: °Benzoyl Peroxide °Salicylic Acid ° °Backache/Headache: °Tylenol: 2 regular strength every 4 hours OR °             2 Extra strength every 6 hours ° °Colds/Coughs/Allergies: °Benadryl (alcohol free) 25 mg every 6 hours as needed °Breath right strips °Claritin °Cepacol throat lozenges °Chloraseptic throat spray °Cold-Eeze- up to three times per day °Cough drops, alcohol free °Flonase (by prescription only) °Guaifenesin °Mucinex °Robitussin DM (plain only, alcohol free) °Saline nasal spray/drops °Sudafed (pseudoephedrine) & Actifed ** use only after [redacted] weeks gestation and if you do not have high blood pressure °Tylenol °Vicks Vaporub °Zinc lozenges °Zyrtec  ° °Constipation: °Colace °Ducolax suppositories °Fleet enema °Glycerin suppositories °Metamucil °Milk of magnesia °Miralax °Senokot °Smooth move tea ° °Diarrhea: °Kaopectate °Imodium A-D ° °*NO pepto Bismol ° °Hemorrhoids: °Anusol °Anusol HC °Preparation H °Tucks ° °Indigestion: °Tums °Maalox °Mylanta °Zantac  °Pepcid ° °Insomnia: °Benadryl (alcohol free) 25mg every 6 hours as needed °Tylenol PM °Unisom, no Gelcaps ° °Leg Cramps: °Tums °MagGel ° °Nausea/Vomiting:  °Bonine °Dramamine °Emetrol °Ginger extract °Sea bands °Meclizine  °Nausea medication to take during pregnancy:  °Unisom (doxylamine succinate 25 mg tablets) Take one tablet daily at bedtime. If symptoms are not adequately controlled, the dose can be increased to a maximum recommended dose of two tablets daily (1/2 tablet in the morning, 1/2 tablet  mid-afternoon and one at bedtime). °Vitamin B6 100mg tablets. Take one tablet twice a day (up to 200 mg per day). ° °Skin Rashes: °Aveeno products °Benadryl cream or 25mg every 6 hours as needed °Calamine Lotion °1% cortisone cream ° °Yeast infection: °Gyne-lotrimin 7 °Monistat 7 ° ° °**If taking multiple medications, please check labels to avoid duplicating the same active ingredients °**take medication as directed on the label °** Do not exceed 4000 mg of tylenol in 24 hours °**Do not take medications that contain aspirin or ibuprofen ° ° ° ° °

## 2018-01-18 NOTE — Progress Notes (Signed)
Pt is in the office for Rhogam, administered LUOQ, and pt tolerated well .Marland Kitchen. Administrations This Visit    rho (d) immune globulin (RHIG/RHOPHYLAC) injection 300 mcg    Admin Date 01/18/2018 Action Given Dose 300 mcg Route Intramuscular Administered By Katrina StackStalling, Jax Abdelrahman D, RN

## 2018-01-24 ENCOUNTER — Encounter: Payer: Commercial Managed Care - PPO | Admitting: Advanced Practice Midwife

## 2018-01-29 ENCOUNTER — Encounter (HOSPITAL_COMMUNITY): Payer: Self-pay

## 2018-01-30 ENCOUNTER — Ambulatory Visit (HOSPITAL_COMMUNITY)
Admission: RE | Admit: 2018-01-30 | Discharge: 2018-01-30 | Disposition: A | Discharge status: Home / Self Care | Payer: Commercial Managed Care - PPO | Source: Ambulatory Visit | Attending: Obstetrics and Gynecology | Admitting: Obstetrics and Gynecology

## 2018-01-30 ENCOUNTER — Encounter (HOSPITAL_COMMUNITY): Payer: Self-pay

## 2018-01-30 ENCOUNTER — Other Ambulatory Visit (HOSPITAL_COMMUNITY): Payer: Self-pay | Admitting: Maternal & Fetal Medicine

## 2018-01-30 DIAGNOSIS — O09523 Supervision of elderly multigravida, third trimester: Secondary | ICD-10-CM | POA: Insufficient documentation

## 2018-01-30 DIAGNOSIS — O26843 Uterine size-date discrepancy, third trimester: Secondary | ICD-10-CM | POA: Diagnosis not present

## 2018-01-30 DIAGNOSIS — O09293 Supervision of pregnancy with other poor reproductive or obstetric history, third trimester: Secondary | ICD-10-CM | POA: Diagnosis not present

## 2018-01-30 DIAGNOSIS — O09213 Supervision of pregnancy with history of pre-term labor, third trimester: Secondary | ICD-10-CM | POA: Diagnosis not present

## 2018-01-30 DIAGNOSIS — Z3A30 30 weeks gestation of pregnancy: Secondary | ICD-10-CM

## 2018-01-30 DIAGNOSIS — O24410 Gestational diabetes mellitus in pregnancy, diet controlled: Secondary | ICD-10-CM

## 2018-01-30 DIAGNOSIS — Z362 Encounter for other antenatal screening follow-up: Secondary | ICD-10-CM | POA: Insufficient documentation

## 2018-01-30 DIAGNOSIS — O09299 Supervision of pregnancy with other poor reproductive or obstetric history, unspecified trimester: Secondary | ICD-10-CM

## 2018-01-31 ENCOUNTER — Other Ambulatory Visit (HOSPITAL_COMMUNITY): Payer: Self-pay | Admitting: *Deleted

## 2018-01-31 DIAGNOSIS — O09523 Supervision of elderly multigravida, third trimester: Secondary | ICD-10-CM

## 2018-02-06 ENCOUNTER — Ambulatory Visit (INDEPENDENT_AMBULATORY_CARE_PROVIDER_SITE_OTHER): Payer: Commercial Managed Care - PPO | Admitting: Obstetrics and Gynecology

## 2018-02-06 ENCOUNTER — Encounter: Payer: Self-pay | Admitting: Obstetrics and Gynecology

## 2018-02-06 VITALS — BP 126/83 | HR 92 | Wt 240.0 lb

## 2018-02-06 DIAGNOSIS — O09299 Supervision of pregnancy with other poor reproductive or obstetric history, unspecified trimester: Secondary | ICD-10-CM

## 2018-02-06 DIAGNOSIS — O24410 Gestational diabetes mellitus in pregnancy, diet controlled: Secondary | ICD-10-CM

## 2018-02-06 DIAGNOSIS — O26899 Other specified pregnancy related conditions, unspecified trimester: Secondary | ICD-10-CM

## 2018-02-06 DIAGNOSIS — O0993 Supervision of high risk pregnancy, unspecified, third trimester: Secondary | ICD-10-CM

## 2018-02-06 DIAGNOSIS — O26893 Other specified pregnancy related conditions, third trimester: Secondary | ICD-10-CM

## 2018-02-06 DIAGNOSIS — O0990 Supervision of high risk pregnancy, unspecified, unspecified trimester: Secondary | ICD-10-CM

## 2018-02-06 DIAGNOSIS — Z6791 Unspecified blood type, Rh negative: Secondary | ICD-10-CM

## 2018-02-06 NOTE — Progress Notes (Signed)
Pt is back to checking blood sugars'pt reports they have been fine.

## 2018-02-06 NOTE — Progress Notes (Signed)
   PRENATAL VISIT NOTE  Subjective:  Kathy Piananika R Navarro is a 37 y.o. [id number] at 2458w1d being seen today for ongoing prenatal care.  She is currently monitored for the following issues for this high-risk pregnancy and has Supervision of high risk pregnancy, antepartum; History of preterm delivery; Rh negative state in antepartum period; Low vitamin D level; Elevated hemoglobin A1c; Gestational diabetes; Abnormal glucose tolerance test (GTT) during pregnancy, antepartum; and Prior pregnancy with congenital cardiac defect, antepartum on their problem list.  Patient reports no complaints.  Contractions: Not present. Vag. Bleeding: None.  Movement: Present. Denies leaking of fluid.   The following portions of the patient's history were reviewed and updated as appropriate: allergies, current medications, past family history, past medical history, past social history, past surgical history and problem list. Problem list updated.  Objective:   Vitals:   02/06/18 1123  BP: 126/83  Pulse: 92  Weight: 240 lb (108.9 kg)    Fetal Status: Fetal Heart Rate (bpm): 150(Simultaneous filing. User may not have seen previous data.) Fundal Height: 31 cm Movement: Present     General:  Alert, oriented and cooperative. Patient is in no acute distress.  Skin: Skin is warm and dry. No rash noted.   Cardiovascular: Normal heart rate noted  Respiratory: Normal respiratory effort, no problems with respiration noted  Abdomen: Soft, gravid, appropriate for gestational age.  Pain/Pressure: Absent     Pelvic: Cervical exam deferred        Extremities: Normal range of motion.  Edema: Trace  Mental Status: Normal mood and affect. Normal behavior. Normal judgment and thought content.   Assessment and Plan:  Pregnancy: [id number] at 2358w1d  1. Supervision of high risk pregnancy, antepartum Patient is doing well without complaints  2. Diet controlled gestational diabetes mellitus (GDM) in third trimester Patient did not  bring CBG log but reports highest postprandial of 98 Patient was asked to bring CBG or meter at next visit in order to review values and optimize her care Growth ultrasound reviewed with the patient- Patient has follow up scheduled in december  3. Rh negative state in antepartum period S/p rhogam  4. Prior pregnancy with congenital cardiac defect, antepartum Normal echo  Preterm labor symptoms and general obstetric precautions including but not limited to vaginal bleeding, contractions, leaking of fluid and fetal movement were reviewed in detail with the patient. Please refer to After Visit Summary for other counseling recommendations.  Return in about 2 weeks (around 02/20/2018) for ROB.  Future Appointments  Date Time Provider Department Center  02/27/2018 11:30 AM [facility] US 1 [facility]    Catalina AntiguaPeggy Joeangel Jeanpaul, MD

## 2018-02-14 ENCOUNTER — Encounter: Payer: Commercial Managed Care - PPO | Admitting: Certified Nurse Midwife

## 2018-02-20 ENCOUNTER — Encounter (HOSPITAL_COMMUNITY): Payer: Self-pay

## 2018-02-20 ENCOUNTER — Other Ambulatory Visit: Payer: Self-pay | Admitting: Obstetrics & Gynecology

## 2018-02-20 ENCOUNTER — Ambulatory Visit (HOSPITAL_COMMUNITY)
Admission: RE | Admit: 2018-02-20 | Discharge: 2018-02-20 | Disposition: A | Discharge status: Home / Self Care | Payer: Commercial Managed Care - PPO | Source: Ambulatory Visit | Attending: Obstetrics & Gynecology | Admitting: Obstetrics & Gynecology

## 2018-02-20 ENCOUNTER — Ambulatory Visit (INDEPENDENT_AMBULATORY_CARE_PROVIDER_SITE_OTHER): Payer: Commercial Managed Care - PPO | Admitting: Obstetrics & Gynecology

## 2018-02-20 VITALS — BP 131/81 | HR 94 | Wt 244.0 lb

## 2018-02-20 DIAGNOSIS — O09523 Supervision of elderly multigravida, third trimester: Secondary | ICD-10-CM

## 2018-02-20 DIAGNOSIS — O99213 Obesity complicating pregnancy, third trimester: Secondary | ICD-10-CM | POA: Diagnosis not present

## 2018-02-20 DIAGNOSIS — O0990 Supervision of high risk pregnancy, unspecified, unspecified trimester: Secondary | ICD-10-CM

## 2018-02-20 DIAGNOSIS — O09293 Supervision of pregnancy with other poor reproductive or obstetric history, third trimester: Secondary | ICD-10-CM | POA: Diagnosis not present

## 2018-02-20 DIAGNOSIS — O365990 Maternal care for other known or suspected poor fetal growth, unspecified trimester, not applicable or unspecified: Secondary | ICD-10-CM | POA: Insufficient documentation

## 2018-02-20 DIAGNOSIS — O09299 Supervision of pregnancy with other poor reproductive or obstetric history, unspecified trimester: Secondary | ICD-10-CM

## 2018-02-20 DIAGNOSIS — O365930 Maternal care for other known or suspected poor fetal growth, third trimester, not applicable or unspecified: Secondary | ICD-10-CM

## 2018-02-20 DIAGNOSIS — O0993 Supervision of high risk pregnancy, unspecified, third trimester: Secondary | ICD-10-CM

## 2018-02-20 DIAGNOSIS — O09213 Supervision of pregnancy with history of pre-term labor, third trimester: Secondary | ICD-10-CM | POA: Insufficient documentation

## 2018-02-20 DIAGNOSIS — Z3A33 33 weeks gestation of pregnancy: Secondary | ICD-10-CM | POA: Diagnosis not present

## 2018-02-20 DIAGNOSIS — O24410 Gestational diabetes mellitus in pregnancy, diet controlled: Secondary | ICD-10-CM | POA: Diagnosis not present

## 2018-02-20 NOTE — Progress Notes (Signed)
   PRENATAL VISIT NOTE  Subjective:  Kathy Piananika R Navarro is a 37 y.o. [id number] at 2531w1d being seen today for ongoing prenatal care.  She is currently monitored for the following issues for this high-risk pregnancy and has Supervision of high risk pregnancy, antepartum; History of preterm delivery; Rh negative state in antepartum period; Low vitamin D level; Elevated hemoglobin A1c; Gestational diabetes; Abnormal glucose tolerance test (GTT) during pregnancy, antepartum; Prior pregnancy with congenital cardiac defect, antepartum; IUGR (intrauterine growth restriction) affecting care of mother; and AMA (advanced maternal age) multigravida 35+, third trimester on their problem list.  Patient reports occasional contractions.  Contractions: Not present. Vag. Bleeding: None.  Movement: Present. Denies leaking of fluid.   The following portions of the patient's history were reviewed and updated as appropriate: allergies, current medications, past family history, past medical history, past social history, past surgical history and problem list. Problem list updated.  Objective:   Vitals:   02/20/18 1132  BP: 131/81  Pulse: 94  Weight: 244 lb (110.7 kg)    Fetal Status: Fetal Heart Rate (bpm): 148 Fundal Height: 33 cm Movement: Present  Presentation: Undeterminable  General:  Alert, oriented and cooperative. Patient is in no acute distress.  Skin: Skin is warm and dry. No rash noted.   Cardiovascular: Normal heart rate noted  Respiratory: Normal respiratory effort, no problems with respiration noted  Abdomen: Soft, gravid, appropriate for gestational age.  Pain/Pressure: Present     Pelvic: Cervical exam performed Dilation: Closed Effacement (%): 20 Station: Ballotable  Extremities: Normal range of motion.  Edema: None  Mental Status: Normal mood and affect. Normal behavior. Normal judgment and thought content.   Assessment and Plan:  Pregnancy: [id number] at 1231w1d  1. Diet controlled gestational  diabetes mellitus (GDM) in third trimester Reports all values below 100  2. Supervision of high risk pregnancy, antepartum IUGR needs antenatal testing - US MFM FETAL BPP WO NON STRESS; Future  3. Intrauterine growth restriction (IUGR) affecting care of mother, third trimester, single or unspecified fetus  - US MFM FETAL BPP WO NON STRESS; Future  4. AMA (advanced maternal age) multigravida 35+, third trimester   Preterm labor symptoms and general obstetric precautions including but not limited to vaginal bleeding, contractions, leaking of fluid and fetal movement were reviewed in detail with the patient. Please refer to After Visit Sum  mary for other counseling recommendations.  Return in about 1 week (around 02/27/2018). Future Appointments  Date Time Provider Department Center  02/27/2018 11:30 AM [facility] US 1 [facility]    Scheryl DarterJames Wetona Viramontes, MD

## 2018-02-20 NOTE — Progress Notes (Signed)
CC: vaginal pain and pressure HA's no visual changes

## 2018-02-20 NOTE — Patient Instructions (Signed)

## 2018-02-26 ENCOUNTER — Ambulatory Visit (INDEPENDENT_AMBULATORY_CARE_PROVIDER_SITE_OTHER): Payer: Commercial Managed Care - PPO | Admitting: Obstetrics and Gynecology

## 2018-02-26 ENCOUNTER — Encounter: Payer: Self-pay | Admitting: Obstetrics and Gynecology

## 2018-02-26 VITALS — BP 115/75 | HR 102 | Wt 246.6 lb

## 2018-02-26 DIAGNOSIS — O24410 Gestational diabetes mellitus in pregnancy, diet controlled: Secondary | ICD-10-CM

## 2018-02-26 DIAGNOSIS — O365930 Maternal care for other known or suspected poor fetal growth, third trimester, not applicable or unspecified: Secondary | ICD-10-CM

## 2018-02-26 DIAGNOSIS — O26893 Other specified pregnancy related conditions, third trimester: Secondary | ICD-10-CM

## 2018-02-26 DIAGNOSIS — Z6791 Unspecified blood type, Rh negative: Secondary | ICD-10-CM

## 2018-02-26 DIAGNOSIS — O0990 Supervision of high risk pregnancy, unspecified, unspecified trimester: Secondary | ICD-10-CM

## 2018-02-26 DIAGNOSIS — O09523 Supervision of elderly multigravida, third trimester: Secondary | ICD-10-CM

## 2018-02-26 DIAGNOSIS — O26899 Other specified pregnancy related conditions, unspecified trimester: Secondary | ICD-10-CM

## 2018-02-26 DIAGNOSIS — O0993 Supervision of high risk pregnancy, unspecified, third trimester: Secondary | ICD-10-CM

## 2018-02-26 NOTE — Patient Instructions (Signed)
Third Trimester of Pregnancy The third trimester is from week 28 through week 40 (months 7 through 9). The third trimester is a time when the unborn baby (fetus) is growing rapidly. At the end of the ninth month, the fetus is about 20 inches in length and weighs 6-10 pounds. Body changes during your third trimester Your body will continue to go through many changes during pregnancy. The changes vary from woman to woman. During the third trimester:  Your weight will continue to increase. You can expect to gain 25-35 pounds (11-16 kg) by the end of the pregnancy.  You may begin to get stretch marks on your hips, abdomen, and breasts.  You may urinate more often because the fetus is moving lower into your pelvis and pressing on your bladder.  You may develop or continue to have heartburn. This is caused by increased hormones that slow down muscles in the digestive tract.  You may develop or continue to have constipation because increased hormones slow digestion and cause the muscles that push waste through your intestines to relax.  You may develop hemorrhoids. These are swollen veins (varicose veins) in the rectum that can itch or be painful.  You may develop swollen, bulging veins (varicose veins) in your legs.  You may have increased body aches in the pelvis, back, or thighs. This is due to weight gain and increased hormones that are relaxing your joints.  You may have changes in your hair. These can include thickening of your hair, rapid growth, and changes in texture. Some women also have hair loss during or after pregnancy, or hair that feels dry or thin. Your hair will most likely return to normal after your baby is born.  Your breasts will continue to grow and they will continue to become tender. A yellow fluid (colostrum) may leak from your breasts. This is the first milk you are producing for your baby.  Your belly button may stick out.  You may notice more swelling in your hands,  face, or ankles.  You may have increased tingling or numbness in your hands, arms, and legs. The skin on your belly may also feel numb.  You may feel short of breath because of your expanding uterus.  You may have more problems sleeping. This can be caused by the size of your belly, increased need to urinate, and an increase in your body's metabolism.  You may notice the fetus "dropping," or moving lower in your abdomen (lightening).  You may have increased vaginal discharge.  You may notice your joints feel loose and you may have pain around your pelvic bone. What to expect at prenatal visits You will have prenatal exams every 2 weeks until week 36. Then you will have weekly prenatal exams. During a routine prenatal visit:  You will be weighed to make sure you and the baby are growing normally.  Your blood pressure will be taken.  Your abdomen will be measured to track your baby's growth.  The fetal heartbeat will be listened to.  Any test results from the previous visit will be discussed.  You may have a cervical check near your due date to see if your cervix has softened or thinned (effaced).  You will be tested for Group B streptococcus. This happens between 35 and 37 weeks. Your health care provider may ask you:  What your birth plan is.  How you are feeling.  If you are feeling the baby move.  If you have had any abnormal   symptoms, such as leaking fluid, bleeding, severe headaches, or abdominal cramping.  If you are using any tobacco products, including cigarettes, chewing tobacco, and electronic cigarettes.  If you have any questions. Other tests or screenings that may be performed during your third trimester include:  Blood tests that check for low iron levels (anemia).  Fetal testing to check the health, activity level, and growth of the fetus. Testing is done if you have certain medical conditions or if there are problems during the pregnancy.  Nonstress test  (NST). This test checks the health of your baby to make sure there are no signs of problems, such as the baby not getting enough oxygen. During this test, a belt is placed around your belly. The baby is made to move, and its heart rate is monitored during movement. What is false labor? False labor is a condition in which you feel small, irregular tightenings of the muscles in the womb (contractions) that usually go away with rest, changing position, or drinking water. These are called Braxton Hicks contractions. Contractions may last for hours, days, or even weeks before true labor sets in. If contractions come at regular intervals, become more frequent, increase in intensity, or become painful, you should see your health care provider. What are the signs of labor?  Abdominal cramps.  Regular contractions that start at 10 minutes apart and become stronger and more frequent with time.  Contractions that start on the top of the uterus and spread down to the lower abdomen and back.  Increased pelvic pressure and dull back pain.  A watery or bloody mucus discharge that comes from the vagina.  Leaking of amniotic fluid. This is also known as your "water breaking." It could be a slow trickle or a gush. Let your health care provider know if it has a color or strange odor. If you have any of these signs, call your health care provider right away, even if it is before your due date. Follow these instructions at home: Medicines  Follow your health care provider's instructions regarding medicine use. Specific medicines may be either safe or unsafe to take during pregnancy.  Take a prenatal vitamin that contains at least 600 micrograms (mcg) of folic acid.  If you develop constipation, try taking a stool softener if your health care provider approves. Eating and drinking   Eat a balanced diet that includes fresh fruits and vegetables, whole grains, good sources of protein such as meat, eggs, or tofu,  and low-fat dairy. Your health care provider will help you determine the amount of weight gain that is right for you.  Avoid raw meat and uncooked cheese. These carry germs that can cause birth defects in the baby.  If you have low calcium intake from food, talk to your health care provider about whether you should take a daily calcium supplement.  Eat four or five small meals rather than three large meals a day.  Limit foods that are high in fat and processed sugars, such as fried and sweet foods.  To prevent constipation: ? Drink enough fluid to keep your urine clear or pale yellow. ? Eat foods that are high in fiber, such as fresh fruits and vegetables, whole grains, and beans. Activity  Exercise only as directed by your health care provider. Most women can continue their usual exercise routine during pregnancy. Try to exercise for 30 minutes at least 5 days a week. Stop exercising if you experience uterine contractions.  Avoid heavy lifting.  Do   not exercise in extreme heat or humidity, or at high altitudes.  Wear low-heel, comfortable shoes.  Practice good posture.  You may continue to have sex unless your health care provider tells you otherwise. Relieving pain and discomfort  Take frequent breaks and rest with your legs elevated if you have leg cramps or low back pain.  Take warm sitz baths to soothe any pain or discomfort caused by hemorrhoids. Use hemorrhoid cream if your health care provider approves.  Wear a good support bra to prevent discomfort from breast tenderness.  If you develop varicose veins: ? Wear support pantyhose or compression stockings as told by your healthcare provider. ? Elevate your feet for 15 minutes, 3-4 times a day. Prenatal care  Write down your questions. Take them to your prenatal visits.  Keep all your prenatal visits as told by your health care provider. This is important. Safety  Wear your seat belt at all times when driving.  Make  a list of emergency phone numbers, including numbers for family, friends, the hospital, and police and fire departments. General instructions  Avoid cat litter boxes and soil used by cats. These carry germs that can cause birth defects in the baby. If you have a cat, ask someone to clean the litter box for you.  Do not travel far distances unless it is absolutely necessary and only with the approval of your health care provider.  Do not use hot tubs, steam rooms, or saunas.  Do not drink alcohol.  Do not use any products that contain nicotine or tobacco, such as cigarettes and e-cigarettes. If you need help quitting, ask your health care provider.  Do not use any medicinal herbs or unprescribed drugs. These chemicals affect the formation and growth of the baby.  Do not douche or use tampons or scented sanitary pads.  Do not cross your legs for long periods of time.  To prepare for the arrival of your baby: ? Take prenatal classes to understand, practice, and ask questions about labor and delivery. ? Make a trial run to the hospital. ? Visit the hospital and tour the maternity area. ? Arrange for maternity or paternity leave through employers. ? Arrange for family and friends to take care of pets while you are in the hospital. ? Purchase a rear-facing car seat and make sure you know how to install it in your car. ? Pack your hospital bag. ? Prepare the baby's nursery. Make sure to remove all pillows and stuffed animals from the baby's crib to prevent suffocation.  Visit your dentist if you have not gone during your pregnancy. Use a soft toothbrush to brush your teeth and be gentle when you floss. Contact a health care provider if:  You are unsure if you are in labor or if your water has broken.  You become dizzy.  You have mild pelvic cramps, pelvic pressure, or nagging pain in your abdominal area.  You have lower back pain.  You have persistent nausea, vomiting, or  diarrhea.  You have an unusual or bad smelling vaginal discharge.  You have pain when you urinate. Get help right away if:  Your water breaks before 37 weeks.  You have regular contractions less than 5 minutes apart before 37 weeks.  You have a fever.  You are leaking fluid from your vagina.  You have spotting or bleeding from your vagina.  You have severe abdominal pain or cramping.  You have rapid weight loss or weight gain.  You have   shortness of breath with chest pain.  You notice sudden or extreme swelling of your face, hands, ankles, feet, or legs.  Your baby makes fewer than 10 movements in 2 hours.  You have severe headaches that do not go away when you take medicine.  You have vision changes. Summary  The third trimester is from week 28 through week 40, months 7 through 9. The third trimester is a time when the unborn baby (fetus) is growing rapidly.  During the third trimester, your discomfort may increase as you and your baby continue to gain weight. You may have abdominal, leg, and back pain, sleeping problems, and an increased need to urinate.  During the third trimester your breasts will keep growing and they will continue to become tender. A yellow fluid (colostrum) may leak from your breasts. This is the first milk you are producing for your baby.  False labor is a condition in which you feel small, irregular tightenings of the muscles in the womb (contractions) that eventually go away. These are called Braxton Hicks contractions. Contractions may last for hours, days, or even weeks before true labor sets in.  Signs of labor can include: abdominal cramps; regular contractions that start at 10 minutes apart and become stronger and more frequent with time; watery or bloody mucus discharge that comes from the vagina; increased pelvic pressure and dull back pain; and leaking of amniotic fluid. This information is not intended to replace advice given to you by your  health care provider. Make sure you discuss any questions you have with your health care provider. Document Released: 02/15/2001 Document Revised: 03/29/2016 Document Reviewed: 03/29/2016 Elsevier Interactive Patient Education  2019 [employer]  

## 2018-02-26 NOTE — Progress Notes (Signed)
Pt is here for ROB. 34w. Pt states BG levels have been good.

## 2018-02-26 NOTE — Progress Notes (Signed)
Subjective:  Erby Piananika R Moncrieffe is a 37 y.o. [id number] at 1467w0d being seen today for ongoing prenatal care.  She is currently monitored for the following issues for this high-risk pregnancy and has Supervision of high risk pregnancy, antepartum; History of preterm delivery; Rh negative state in antepartum period; Low vitamin D level; Elevated hemoglobin A1c; Gestational diabetes; Abnormal glucose tolerance test (GTT) during pregnancy, antepartum; Prior pregnancy with congenital cardiac defect, antepartum; IUGR (intrauterine growth restriction) affecting care of mother; and AMA (advanced maternal age) multigravida 35+, third trimester on their problem list.  Patient reports general discomforts of pregnancy.  Contractions: Not present. Vag. Bleeding: None.  Movement: Present. Denies leaking of fluid.   The following portions of the patient's history were reviewed and updated as appropriate: allergies, current medications, past family history, past medical history, past social history, past surgical history and problem list. Problem list updated.  Objective:   Vitals:   02/26/18 1036  BP: 115/75  Pulse: (!) 102  Weight: 246 lb 9.6 oz (111.9 kg)    Fetal Status: Fetal Heart Rate (bpm): 144   Movement: Present     General:  Alert, oriented and cooperative. Patient is in no acute distress.  Skin: Skin is warm and dry. No rash noted.   Cardiovascular: Normal heart rate noted  Respiratory: Normal respiratory effort, no problems with respiration noted  Abdomen: Soft, gravid, appropriate for gestational age. Pain/Pressure: Absent     Pelvic:  Cervical exam deferred        Extremities: Normal range of motion.  Edema: Trace  Mental Status: Normal mood and affect. Normal behavior. Normal judgment and thought content.   Urinalysis:      Assessment and Plan:  Pregnancy: [id number] at 7267w0d  1. Supervision of high risk pregnancy, antepartum Stable  2. Diet controlled gestational diabetes mellitus  (GDM) in third trimester Reports CBG's in goal range However did not bring readings Importance of bringing CBG's to OV U/S for growth tomorrow   3. Intrauterine growth restriction (IUGR) affecting care of mother, third trimester, single or unspecified fetus Growth scan tomorrow  4. Rh negative state in antepartum period S/P Rhogam  5. AMA (advanced maternal age) multigravida 35+, third trimester Low risk NIPS  Preterm labor symptoms and general obstetric precautions including but not limited to vaginal bleeding, contractions, leaking of fluid and fetal movement were reviewed in detail with the patient. Please refer to After Visit Summary for other counseling recommendations.  Return in about 1 week (around 03/05/2018) for OB visit.   Hermina StaggersErvin, Halynn Reitano L, MD

## 2018-02-27 ENCOUNTER — Other Ambulatory Visit (HOSPITAL_COMMUNITY): Payer: Self-pay | Admitting: Maternal & Fetal Medicine

## 2018-02-27 ENCOUNTER — Encounter (HOSPITAL_COMMUNITY): Payer: Self-pay

## 2018-02-27 ENCOUNTER — Ambulatory Visit (HOSPITAL_COMMUNITY)
Admission: RE | Admit: 2018-02-27 | Discharge: 2018-02-27 | Disposition: A | Discharge status: Home / Self Care | Payer: Commercial Managed Care - PPO | Source: Ambulatory Visit | Attending: Obstetrics & Gynecology | Admitting: Obstetrics & Gynecology

## 2018-02-27 DIAGNOSIS — Z362 Encounter for other antenatal screening follow-up: Secondary | ICD-10-CM | POA: Diagnosis not present

## 2018-02-27 DIAGNOSIS — O09213 Supervision of pregnancy with history of pre-term labor, third trimester: Secondary | ICD-10-CM

## 2018-02-27 DIAGNOSIS — Z3A34 34 weeks gestation of pregnancy: Secondary | ICD-10-CM | POA: Diagnosis not present

## 2018-02-27 DIAGNOSIS — O99213 Obesity complicating pregnancy, third trimester: Secondary | ICD-10-CM | POA: Diagnosis not present

## 2018-02-27 DIAGNOSIS — O09293 Supervision of pregnancy with other poor reproductive or obstetric history, third trimester: Secondary | ICD-10-CM | POA: Diagnosis not present

## 2018-02-27 DIAGNOSIS — O24410 Gestational diabetes mellitus in pregnancy, diet controlled: Secondary | ICD-10-CM

## 2018-02-27 DIAGNOSIS — O09523 Supervision of elderly multigravida, third trimester: Secondary | ICD-10-CM

## 2018-03-01 ENCOUNTER — Other Ambulatory Visit (HOSPITAL_COMMUNITY): Payer: Self-pay | Admitting: *Deleted

## 2018-03-01 DIAGNOSIS — Z362 Encounter for other antenatal screening follow-up: Secondary | ICD-10-CM

## 2018-03-05 ENCOUNTER — Encounter: Payer: Commercial Managed Care - PPO | Admitting: Obstetrics & Gynecology

## 2018-03-07 NOTE — L&D Delivery Note (Signed)
Delivery Note [name] is a 38 y.o. [id number] at [redacted]w[redacted]d admitted for PPROM, SOL, A1DM.  Labor course: progressed w/o augmentation, got BMZ x1 ROM: 5h 37m with clear fluid  At 2211 a viable female was delivered via spontaneous vaginal delivery (Presentation: ROA).  Vigorous infant placed directly on mom's abdomen for bonding/skin-to-skin. Delayed cord clamping x , then cord clamped x 2, and cut by FOB.  APGAR: see delivery summary ; weight: pending at time of note.  40 units of pitocin diluted in 1000cc LR was infused rapidly IV per protocol. The placenta separated spontaneously and delivered via CCT and maternal pushing effort.  It was inspected and appears to be intact with a 3 VC.  Placenta/Cord with the following complications: none .  Cord pH: not done  Intrapartum complications:  None Anesthesia:  none Episiotomy: none Lacerations:  none Suture Repair: n/a Est. Blood Loss (mL): 250 Sponge and instrument count were correct x2.  Mom to postpartum.  Baby to Couplet care / Skin to Skin. Placenta to L&D. Plans to breast & bottlefeed Contraception: Nexplanon Circ: n/a  Cheral Marker CNM, WHNP-BC 03/20/2018 10:38 PM   Booker, Merlene Laughter, CNM  P [facility]        Please schedule this patient for PP visit in: 6 weeks  High risk pregnancy complicated by: GDM  Delivery mode: SVD  Anticipated Birth Control: Nexplanon  PP Procedures needed: 2 hour GTT  Schedule Integrated BH visit: no  Provider: Any provider

## 2018-03-09 ENCOUNTER — Ambulatory Visit (INDEPENDENT_AMBULATORY_CARE_PROVIDER_SITE_OTHER): Payer: Commercial Managed Care - PPO | Admitting: Obstetrics & Gynecology

## 2018-03-09 ENCOUNTER — Encounter: Payer: Self-pay | Admitting: Obstetrics & Gynecology

## 2018-03-09 VITALS — BP 125/80 | HR 90 | Wt 245.0 lb

## 2018-03-09 DIAGNOSIS — O0990 Supervision of high risk pregnancy, unspecified, unspecified trimester: Secondary | ICD-10-CM

## 2018-03-09 DIAGNOSIS — O24410 Gestational diabetes mellitus in pregnancy, diet controlled: Secondary | ICD-10-CM

## 2018-03-09 NOTE — Progress Notes (Signed)
   PRENATAL VISIT NOTE  Subjective:  [name] is a 38 y.o. [id number] at [redacted]w[redacted]d being seen today for ongoing prenatal care.  She is currently monitored for the following issues for this high-risk pregnancy and has Supervision of high risk pregnancy, antepartum; History of preterm delivery; Rh negative state in antepartum period; Low vitamin D level; Elevated hemoglobin A1c; Gestational diabetes; Abnormal glucose tolerance test (GTT) during pregnancy, antepartum; Prior pregnancy with congenital cardiac defect, antepartum; and AMA (advanced maternal age) multigravida 35+, third trimester on their problem list.  Patient reports no complaints.  Contractions: Irritability. Vag. Bleeding: None.  Movement: Present. Denies leaking of fluid.   The following portions of the patient's history were reviewed and updated as appropriate: allergies, current medications, past family history, past medical history, past social history, past surgical history and problem list. Problem list updated.  Objective:   Vitals:   03/09/18 0909  BP: 125/80  Pulse: 90  Weight: 245 lb (111.1 kg)    Fetal Status: Fetal Heart Rate (bpm): 135   Movement: Present     General:  Alert, oriented and cooperative. Patient is in no acute distress.  Skin: Skin is warm and dry. No rash noted.   Cardiovascular: Normal heart rate noted  Respiratory: Normal respiratory effort, no problems with respiration noted  Abdomen: Soft, gravid, appropriate for gestational age.  Pain/Pressure: Present     Pelvic: Cervical exam deferred        Extremities: Normal range of motion.  Edema: Trace  Mental Status: Normal mood and affect. Normal behavior. Normal judgment and thought content.   Assessment and Plan:  Pregnancy: [id number] at [redacted]w[redacted]d  1. Diet controlled gestational diabetes mellitus (GDM) in third trimester Diet control is acceptable, doing well  2. Supervision of high risk pregnancy, antepartum Nl growth at last [place]  12/24  Preterm labor symptoms and general obstetric precautions including but not limited to vaginal bleeding, contractions, leaking of fluid and fetal movement were reviewed in detail with the patient. Please refer to After Visit Summary for other counseling recommendations.  Return in about 1 week (around 03/16/2018).  Future Appointments  Date Time Provider Department Center  03/19/2018  1:45 PM Constant, Gigi Gin, MD [facility] None  03/20/2018  2:30 PM [facility] [place] [facility]    Scheryl Darter, MD

## 2018-03-09 NOTE — Patient Instructions (Signed)

## 2018-03-19 ENCOUNTER — Encounter: Payer: Self-pay | Admitting: Obstetrics and Gynecology

## 2018-03-19 ENCOUNTER — Other Ambulatory Visit (HOSPITAL_COMMUNITY)
Admission: RE | Admit: 2018-03-19 | Discharge: 2018-03-19 | Disposition: A | Discharge status: Home / Self Care | Payer: Commercial Managed Care - PPO | Source: Ambulatory Visit | Attending: Obstetrics and Gynecology | Admitting: Obstetrics and Gynecology

## 2018-03-19 ENCOUNTER — Ambulatory Visit (INDEPENDENT_AMBULATORY_CARE_PROVIDER_SITE_OTHER): Payer: Commercial Managed Care - PPO | Admitting: Obstetrics and Gynecology

## 2018-03-19 VITALS — BP 119/77 | HR 91 | Wt 247.0 lb

## 2018-03-19 DIAGNOSIS — O09523 Supervision of elderly multigravida, third trimester: Secondary | ICD-10-CM

## 2018-03-19 DIAGNOSIS — O26893 Other specified pregnancy related conditions, third trimester: Secondary | ICD-10-CM

## 2018-03-19 DIAGNOSIS — O0990 Supervision of high risk pregnancy, unspecified, unspecified trimester: Secondary | ICD-10-CM

## 2018-03-19 DIAGNOSIS — O26899 Other specified pregnancy related conditions, unspecified trimester: Secondary | ICD-10-CM

## 2018-03-19 DIAGNOSIS — Z3A36 36 weeks gestation of pregnancy: Secondary | ICD-10-CM

## 2018-03-19 DIAGNOSIS — O24410 Gestational diabetes mellitus in pregnancy, diet controlled: Secondary | ICD-10-CM

## 2018-03-19 DIAGNOSIS — O0993 Supervision of high risk pregnancy, unspecified, third trimester: Secondary | ICD-10-CM

## 2018-03-19 DIAGNOSIS — Z6791 Unspecified blood type, Rh negative: Secondary | ICD-10-CM

## 2018-03-19 LAB — OB RESULTS CONSOLE GC/CHLAMYDIA: Gonorrhea: NEGATIVE

## 2018-03-19 NOTE — Progress Notes (Signed)
   PRENATAL VISIT NOTE  Subjective:  [name] is a 38 y.o. [id number] at [redacted]w[redacted]d being seen today for ongoing prenatal care.  She is currently monitored for the following issues for this high-risk pregnancy and has Supervision of high risk pregnancy, antepartum; History of preterm delivery; Rh negative state in antepartum period; Low vitamin D level; Elevated hemoglobin A1c; Gestational diabetes; Abnormal glucose tolerance test (GTT) during pregnancy, antepartum; Prior pregnancy with congenital cardiac defect, antepartum; and AMA (advanced maternal age) multigravida 35+, third trimester on their problem list.  Patient reports no complaints.  Contractions: Not present. Vag. Bleeding: None.  Movement: Present. Denies leaking of fluid.   The following portions of the patient's history were reviewed and updated as appropriate: allergies, current medications, past family history, past medical history, past social history, past surgical history and problem list. Problem list updated.  Objective:   Vitals:   03/19/18 1411  BP: 119/77  Pulse: 91  Weight: 247 lb (112 kg)    Fetal Status: Fetal Heart Rate (bpm): 140 Fundal Height: 36 cm Movement: Present  Presentation: Vertex  General:  Alert, oriented and cooperative. Patient is in no acute distress.  Skin: Skin is warm and dry. No rash noted.   Cardiovascular: Normal heart rate noted  Respiratory: Normal respiratory effort, no problems with respiration noted  Abdomen: Soft, gravid, appropriate for gestational age.  Pain/Pressure: Present     Pelvic: Cervical exam performed Dilation: 2.5 Effacement (%): 50 Station: -3  Extremities: Normal range of motion.     Mental Status: Normal mood and affect. Normal behavior. Normal judgment and thought content.   Assessment and Plan:  Pregnancy: [id number] at [redacted]w[redacted]d  1. Supervision of high risk pregnancy, antepartum Patient is doing well without complaints  2. Diet controlled gestational diabetes  mellitus (GDM) in third trimester Patient did not bring CBG log but reports fasting 83-84 and pp as high as 99 Follow up growth ultrasound tomorrow  3. Rh negative state in antepartum period S/p rhogam  4. AMA (advanced maternal age) multigravida 35+, third trimester   Preterm labor symptoms and general obstetric precautions including but not limited to vaginal bleeding, contractions, leaking of fluid and fetal movement were reviewed in detail with the patient. Please refer to After Visit Summary for other counseling recommendations.  No follow-ups on file.  Future Appointments  Date Time Provider Department Center  03/20/2018  2:30 PM [facility] [place] 1 [facility]    Catalina Antigua, MD

## 2018-03-19 NOTE — Progress Notes (Signed)
Pt states recent increase in HA's and craving ice. Pt needs GBS and cultures today.

## 2018-03-20 ENCOUNTER — Other Ambulatory Visit: Payer: Self-pay

## 2018-03-20 ENCOUNTER — Ambulatory Visit (HOSPITAL_BASED_OUTPATIENT_CLINIC_OR_DEPARTMENT_OTHER)
Admission: RE | Admit: 2018-03-20 | Discharge: 2018-03-20 | Disposition: A | Discharge status: Home / Self Care | Payer: Commercial Managed Care - PPO | Source: Ambulatory Visit | Attending: Obstetrics & Gynecology | Admitting: Obstetrics & Gynecology

## 2018-03-20 ENCOUNTER — Encounter (HOSPITAL_COMMUNITY): Payer: Self-pay

## 2018-03-20 ENCOUNTER — Inpatient Hospital Stay (HOSPITAL_COMMUNITY)
Admission: AD | Admit: 2018-03-20 | Discharge: 2018-03-22 | DRG: 807 | Disposition: A | Discharge status: Home / Self Care | Payer: Commercial Managed Care - PPO | Attending: Family Medicine | Admitting: Family Medicine

## 2018-03-20 ENCOUNTER — Encounter (HOSPITAL_COMMUNITY): Payer: Self-pay | Admitting: *Deleted

## 2018-03-20 ENCOUNTER — Other Ambulatory Visit (HOSPITAL_COMMUNITY): Payer: Self-pay | Admitting: Obstetrics and Gynecology

## 2018-03-20 DIAGNOSIS — Z362 Encounter for other antenatal screening follow-up: Secondary | ICD-10-CM

## 2018-03-20 DIAGNOSIS — O24410 Gestational diabetes mellitus in pregnancy, diet controlled: Secondary | ICD-10-CM | POA: Diagnosis not present

## 2018-03-20 DIAGNOSIS — O42913 Preterm premature rupture of membranes, unspecified as to length of time between rupture and onset of labor, third trimester: Principal | ICD-10-CM | POA: Diagnosis present

## 2018-03-20 DIAGNOSIS — O09299 Supervision of pregnancy with other poor reproductive or obstetric history, unspecified trimester: Secondary | ICD-10-CM | POA: Insufficient documentation

## 2018-03-20 DIAGNOSIS — O09213 Supervision of pregnancy with history of pre-term labor, third trimester: Secondary | ICD-10-CM

## 2018-03-20 DIAGNOSIS — Z3A36 36 weeks gestation of pregnancy: Secondary | ICD-10-CM | POA: Diagnosis not present

## 2018-03-20 DIAGNOSIS — O24420 Gestational diabetes mellitus in childbirth, diet controlled: Secondary | ICD-10-CM | POA: Diagnosis present

## 2018-03-20 DIAGNOSIS — O26893 Other specified pregnancy related conditions, third trimester: Secondary | ICD-10-CM | POA: Diagnosis present

## 2018-03-20 DIAGNOSIS — O09523 Supervision of elderly multigravida, third trimester: Secondary | ICD-10-CM | POA: Diagnosis not present

## 2018-03-20 DIAGNOSIS — Z87891 Personal history of nicotine dependence: Secondary | ICD-10-CM

## 2018-03-20 DIAGNOSIS — O99213 Obesity complicating pregnancy, third trimester: Secondary | ICD-10-CM

## 2018-03-20 DIAGNOSIS — O42013 Preterm premature rupture of membranes, onset of labor within 24 hours of rupture, third trimester: Secondary | ICD-10-CM | POA: Diagnosis not present

## 2018-03-20 DIAGNOSIS — O4290 Premature rupture of membranes, unspecified as to length of time between rupture and onset of labor, unspecified weeks of gestation: Secondary | ICD-10-CM

## 2018-03-20 DIAGNOSIS — Z6791 Unspecified blood type, Rh negative: Secondary | ICD-10-CM | POA: Diagnosis not present

## 2018-03-20 DIAGNOSIS — O09293 Supervision of pregnancy with other poor reproductive or obstetric history, third trimester: Secondary | ICD-10-CM

## 2018-03-20 HISTORY — DX: Gestational diabetes mellitus in pregnancy, unspecified control: O24.419

## 2018-03-20 LAB — CBC
HCT: 42.3 % (ref 36.0–46.0)
Hemoglobin: 14.1 g/dL (ref 12.0–15.0)
MCH: 29.7 pg (ref 26.0–34.0)
MCHC: 33.3 g/dL (ref 30.0–36.0)
MCV: 89.2 fL (ref 80.0–100.0)
Platelets: 241 10*3/uL (ref 150–400)
RBC: 4.74 MIL/uL (ref 3.87–5.11)
RDW: 15.1 % (ref 11.5–15.5)
WBC: 7.8 10*3/uL (ref 4.0–10.5)
nRBC: 0 % (ref 0.0–0.2)

## 2018-03-20 LAB — TYPE AND SCREEN
ABO/RH(D): O NEG
Antibody Screen: NEGATIVE

## 2018-03-20 LAB — POCT FERN TEST: POCT Fern Test: POSITIVE

## 2018-03-20 LAB — GLUCOSE, CAPILLARY
Glucose-Capillary: 74 mg/dL (ref 70–99)
Glucose-Capillary: 108 mg/dL — ABNORMAL HIGH (ref 70–99)

## 2018-03-20 LAB — CERVICOVAGINAL ANCILLARY ONLY
Chlamydia: NEGATIVE
Neisseria Gonorrhea: NEGATIVE

## 2018-03-20 MED ORDER — LACTATED RINGERS IV SOLN: 500.0000 mL | INTRAVENOUS | Status: DC | PRN

## 2018-03-20 MED ORDER — OXYCODONE-ACETAMINOPHEN 5-325 MG PO TABS
1.0000 | ORAL_TABLET | ORAL | Status: DC | PRN
  Administered 2018-03-20: 1 via ORAL
  Filled 2018-03-20: qty 1

## 2018-03-20 MED ORDER — PHENYLEPHRINE 40 MCG/ML (10ML) SYRINGE FOR IV PUSH (FOR BLOOD PRESSURE SUPPORT)
80.0000 ug | PREFILLED_SYRINGE | INTRAVENOUS | Status: DC | PRN
  Filled 2018-03-20: qty 10

## 2018-03-20 MED ORDER — PENICILLIN G 3 MILLION UNITS IVPB - SIMPLE MED
3.0000 10*6.[IU] | INTRAVENOUS | Status: DC
  Filled 2018-03-20 (×3): qty 100

## 2018-03-20 MED ORDER — OXYTOCIN BOLUS FROM INFUSION
500.0000 mL | Freq: Once | INTRAVENOUS | Status: AC
  Administered 2018-03-20: 500 mL via INTRAVENOUS

## 2018-03-20 MED ORDER — ACETAMINOPHEN 325 MG PO TABS: 650.0000 mg | ORAL_TABLET | ORAL | Status: DC | PRN

## 2018-03-20 MED ORDER — FENTANYL 2.5 MCG/ML BUPIVACAINE 1/10 % EPIDURAL INFUSION (WH - ANES)
INTRAMUSCULAR | Status: AC
  Filled 2018-03-20: qty 100

## 2018-03-20 MED ORDER — SOD CITRATE-CITRIC ACID 500-334 MG/5ML PO SOLN: 30.0000 mL | ORAL | Status: DC | PRN

## 2018-03-20 MED ORDER — BETAMETHASONE SOD PHOS & ACET 6 (3-3) MG/ML IJ SUSP
12.0000 mg | Freq: Once | INTRAMUSCULAR | Status: AC
  Administered 2018-03-20: 12 mg via INTRAMUSCULAR
  Filled 2018-03-20: qty 2

## 2018-03-20 MED ORDER — OXYTOCIN 40 UNITS IN NORMAL SALINE INFUSION - SIMPLE MED
2.5000 [IU]/h | INTRAVENOUS | Status: DC
  Filled 2018-03-20: qty 1000

## 2018-03-20 MED ORDER — FENTANYL CITRATE (PF) 100 MCG/2ML IJ SOLN
100.0000 ug | INTRAMUSCULAR | Status: DC | PRN
  Administered 2018-03-20: 100 ug via INTRAVENOUS
  Filled 2018-03-20: qty 2

## 2018-03-20 MED ORDER — LIDOCAINE HCL (PF) 1 % IJ SOLN
30.0000 mL | INTRAMUSCULAR | Status: DC | PRN
  Filled 2018-03-20: qty 30

## 2018-03-20 MED ORDER — OXYCODONE-ACETAMINOPHEN 5-325 MG PO TABS: 2.0000 | ORAL_TABLET | ORAL | Status: DC | PRN

## 2018-03-20 MED ORDER — EPHEDRINE 5 MG/ML INJ
10.0000 mg | INTRAVENOUS | Status: DC | PRN
  Filled 2018-03-20: qty 2

## 2018-03-20 MED ORDER — DIPHENHYDRAMINE HCL 50 MG/ML IJ SOLN: 12.5000 mg | INTRAMUSCULAR | Status: DC | PRN

## 2018-03-20 MED ORDER — LACTATED RINGERS IV SOLN
500.0000 mL | Freq: Once | INTRAVENOUS | Status: AC
  Administered 2018-03-20: 500 mL via INTRAVENOUS

## 2018-03-20 MED ORDER — ONDANSETRON HCL 4 MG/2ML IJ SOLN: 4.0000 mg | Freq: Four times a day (QID) | INTRAMUSCULAR | Status: DC | PRN

## 2018-03-20 MED ORDER — LACTATED RINGERS IV SOLN
INTRAVENOUS | Status: DC
  Administered 2018-03-20 (×2): via INTRAVENOUS

## 2018-03-20 MED ORDER — SODIUM CHLORIDE 0.9 % IV SOLN
5.0000 10*6.[IU] | Freq: Once | INTRAVENOUS | Status: AC
  Administered 2018-03-20: 5 10*6.[IU] via INTRAVENOUS
  Filled 2018-03-20: qty 5

## 2018-03-20 MED ORDER — PHENYLEPHRINE 40 MCG/ML (10ML) SYRINGE FOR IV PUSH (FOR BLOOD PRESSURE SUPPORT)
PREFILLED_SYRINGE | INTRAVENOUS | Status: AC
  Filled 2018-03-20: qty 20

## 2018-03-20 MED ORDER — FENTANYL 2.5 MCG/ML BUPIVACAINE 1/10 % EPIDURAL INFUSION (WH - ANES): 14.0000 mL/h | INTRAMUSCULAR | Status: DC | PRN

## 2018-03-20 NOTE — MAU Note (Signed)
Pt presents from U/S with SROM, fern slide positive

## 2018-03-20 NOTE — Anesthesia Pain Management Evaluation Note (Signed)
  CR[facility] Pain Management Visit Note  Patient: [name], 38 y.o., female  "Hello I am a member of the anesthesia team at [facility]. We have an anesthesia team available at all times to provide care throughout the hospital, including epidural management and anesthesia for C-section. I don't know your plan for the delivery whether it a natural birth, water birth, IV sedation, nitrous supplementation, doula or epidural, but we want to meet your pain goals."   1.Was your pain managed to your expectations on prior hospitalizations?   Yes   2.What is your expectation for pain management during this hospitalization?     Epidural  3.How can we help you reach that goal?   Record the patient's initial score and the patient's pain goal.   Pain: 3  Pain Goal: 8 [facility] wants you to be able to say your pain was always managed very well.  Laban Emperor 03/20/2018

## 2018-03-20 NOTE — H&P (Addendum)
[name] is a 38 y.o. [id number] at [redacted]w[redacted]d presenting for PROM@1700  and SOL. Patient is alert and cooperative with very effective coping and deep breathing. Was 3cm at last CE in office, wants to try walking and sitting on the ball to get labor going instead of immediately starting Pitocin. Prefers not to use an epidural for pain relief, ok with IV pain meds and NO. 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 Anemia   . Gestational diabetes    Past Surgical History:  Procedure Laterality Date  . TONSILLECTOMY  2017   Family History: family history includes Hyperlipidemia in her mother. Social History:  reports that she quit smoking about 2 years ago. Her smoking use included cigarettes. She has never used smokeless tobacco. She reports that she does not drink alcohol or use drugs.     Maternal Diabetes: Yes:  Diabetes Type:  Diet controlled Genetic Screening: Normal Maternal Ultrasounds/Referrals: Normal Fetal Ultrasounds or other Referrals:  None Maternal Substance Abuse:  No Significant Maternal Medications:  None Significant Maternal Lab Results:  Lab values include: Rh negative Other Comments:  GBS status unknown- pending from 1/13  Review of Systems  Constitutional: Negative.   HENT: Negative.   Eyes: Negative.  Negative for blurred vision.  Respiratory: Negative.  Negative for shortness of breath.   Cardiovascular: Negative.  Negative for chest pain and palpitations.  Gastrointestinal: Positive for heartburn (throughout pregnancy, none now). Negative for abdominal pain, constipation, diarrhea, nausea and vomiting.  Genitourinary: Negative.  Negative for dysuria, flank  pain, frequency and urgency.  Musculoskeletal: Negative.   Skin: Negative.   Neurological: Negative.  Negative for dizziness, weakness and headaches.  Endo/Heme/Allergies: Negative.   Psychiatric/Behavioral: Negative.  Negative for depression. The patient is not nervous/anxious.    Maternal Medical History:  Reason for admission: Rupture of membranes.  Nausea.  Contractions: Onset was less than 1 hour ago.   Frequency: regular.   Perceived severity is mild.    Fetal activity: Perceived fetal activity is normal.   Last perceived fetal movement was within the past hour.    Prenatal complications: Preterm labor.   Prenatal Complications - Diabetes: type 1. Diabetes is managed by diet.        Height 5\' 6"  (1.676 m), weight 111.6 kg, last menstrual period 07/03/2017. Maternal Exam:  Uterine Assessment: Contraction strength is mild.  Contraction frequency is regular.   Abdomen: Patient reports no abdominal tenderness.   Fetal Exam Fetal Monitor Review: Mode: ultrasound.   Baseline rate: 135.  Variability: moderate (6-25 bpm).   Pattern: accelerations present and no decelerations.    Fetal State Assessment: Category I - tracings are normal.     Physical Exam  Nursing note and vitals reviewed. Constitutional: She is oriented to person, place, and time. She appears well-developed and well-nourished. No distress.  HENT:  Head: Normocephalic.  Neck: Normal range of motion.  Cardiovascular: Normal rate  and regular rhythm.  Respiratory: Effort normal. No respiratory distress.  GI: Soft. She exhibits no distension. There is no abdominal tenderness.  Musculoskeletal: Normal range of motion.  Neurological: She is alert and oriented to person, place, and time.  Skin: Skin is warm and dry. She is not diaphoretic.  Psychiatric: She has a normal mood and affect. Her behavior is normal. Judgment and thought content normal.  Hx recent IUFD (April 2019), stated "this is my rainbow  baby, but I'm ok. I've been to therapy. Just don't want any extra emotion about it."     Prenatal labs: ABO, Rh: O/Negative/-- (07/09 1625) Antibody: Negative (07/09 1625) Rubella: 6.30 (07/09 1625) RPR: Non Reactive (11/07 1253)  HBsAg: Negative (07/09 1625)  HIV: Non Reactive (11/07 1253)  GBS:  Unknown (Tested yesterday, not yet resulted)  Assessment/Plan: Latent labor, patient to ambulate and sit on ball to increase contractions. Will monitor for need of oxytocin titration. GBS prophylaxis Betamethasone for PTL   Bernerd Limbo, SNM 03/20/2018, 6:55 PM  CNM attestation:  I have seen and examined this patient; I agree with above documentation in the student midwife's note.   [name] is a 38 y.o. [id number] here for PROM while in MFM getting a growth U/S this evening at 1700  PE: BP 120/68   Pulse 98   Resp 16   Ht 5\' 6"  (1.676 m)   Wt 111.6 kg   LMP 07/03/2017 (Approximate)   BMI 39.71 kg/m  Gen: coping well w/ ctx Resp: normal effort, no distress Abd: gravid  ROS, labs, PMH reviewed  CBG 74  Plan: Admit to [facility] GBS pending from 1/13- PCN ordered BMZ dose ordered Expectant management to start CBGs q 4h Anticipate SVD  Arabella Merles CNM 03/20/2018, 8:14 PM

## 2018-03-20 NOTE — Discharge Summary (Signed)
Postpartum Discharge Summary   Patient Name: [name] DOB: 12-Jan-1981 MRN: [id number]  Date of admission: 03/20/2018 Delivering Provider: Wells Guiles R   Date of discharge: 03/22/2018  Admitting diagnosis: 37wks water broke Intrauterine pregnancy: [redacted]w[redacted]d    Secondary diagnosis:  Active Problems:   PROM (premature rupture of membranes)  Additional problems: none Discharge diagnosis: Preterm Pregnancy Delivered and GDM A1                 Post partum procedures:Rhogam administered (infant Rh+) Augmentation: none  Complications: None  Hospital course:  Onset of Labor With Vaginal Delivery     38y.o. yo [id number] admitted in Latent Labor on 03/20/2018. Quickly progressed to complete. Patient had an uncomplicated labor course as follows:  Membrane Rupture Time/Date: 4:45 PM ,03/20/2018   Intrapartum Procedures: Episiotomy: None [1]                                         Lacerations:  None [1]  Patient had a delivery of a Viable infant. 03/20/2018  Information for the patient's newborn:  LoFalisa, Lamora0[[id number]]Delivery Method: Vag-Spont   Patient had an uncomplicated postpartum course.  She is ambulating, tolerating a regular diet, passing flatus, and urinating well. Patient is discharged home in stable condition on 03/22/18.  Magnesium Sulfate recieved: No BMZ received: Yes x 1 (delivered before 2nd dose)  Physical exam  Vitals:   03/21/18 0510 03/21/18 0945 03/21/18 1350 03/21/18 1501  BP: 124/73 117/83 128/68 129/73  Pulse: 89 85 85 80  Resp: '17 18 18 18  ' Temp: 98.9 F (37.2 C) 98.5 F (36.9 C) 98.7 F (37.1 C) 98.4 F (36.9 C)  TempSrc: Oral Oral Oral Oral  SpO2:  100% 100% 100%  Weight:      Height:       General: alert, well-appearing, NAD Lochia: appropriate Uterine Fundus: firm Incision: N/A DVT Evaluation: No significant calf/ankle edema. Labs: Lab Results  Component Value Date   WBC 7.8 03/20/2018   HGB 14.1 03/20/2018    HCT 42.3 03/20/2018   MCV 89.2 03/20/2018   PLT 241 03/20/2018   CMP Latest Ref Rng & Units 10/19/2017  Glucose 65 - 99 mg/dL 134(H)  BUN 6 - 20 mg/dL 4(L)  Creatinine 0.57 - 1.00 mg/dL 0.55(L)  Sodium 134 - 144 mmol/L 138  Potassium 3.5 - 5.2 mmol/L 3.8  Chloride 96 - 106 mmol/L 105  CO2 20 - 29 mmol/L 17(L)  Calcium 8.7 - 10.2 mg/dL 9.4  Total Protein 6.0 - 8.5 g/dL 6.4  Total Bilirubin 0.0 - 1.2 mg/dL <0.2  Alkaline Phos 39 - 117 IU/L 58  AST 0 - 40 IU/L 11  ALT 0 - 32 IU/L 12    Discharge instruction: per After Visit Summary and "Baby and Me Booklet".  After visit meds:  Allergies as of 03/22/2018   No Known Allergies     Medication List    STOP taking these medications   ACCU-CHEK FASTCLIX LANCETS Misc   ACCU-CHEK GUIDE w/Device Kit   aspirin 81 MG chewable tablet   glucose blood test strip Commonly known as:  ACCU-CHEK GUIDE     TAKE these medications   ibuprofen 800 MG tablet Commonly known as:  ADVIL,MOTRIN Take 1 tablet (800 mg total) by mouth every 8 (eight) hours as needed.   prenatal vitamin w/FE, FA  27-1 MG Tabs tablet Take 1 tablet by mouth daily at 12 noon.   senna-docusate 8.6-50 MG tablet Commonly known as:  Senokot-S Take 2 tablets by mouth at bedtime as needed for mild constipation.   Vitamin D (Ergocalciferol) 1.25 MG (50000 UT) Caps capsule Commonly known as:  DRISDOL Take 1 capsule (50,000 Units total) by mouth every 7 (seven) days.      Diet: routine diet Activity: Advance as tolerated. Pelvic rest for 6 weeks.  Outpatient follow up:4 weeks   Follow up Appt: Future Appointments  Date Time Provider [place]  04/19/2018  9:00 AM Lajean Manes, CNM [facility] None   Follow up Visit: Follow-up [place] Follow up.   Specialty:  Obstetrics and Gynecology Why:  You should receive a call to schedule your postpartum follow-up visit. If you do not, please call clinic to make an  appointment in 4-6 weeks.  Contact information: [address] [place] [phone]         Please schedule this patient for Postpartum visit in: 6 weeks with the following provider: Any provider For C/S patients schedule nurse incision check in weeks 2 weeks: no High risk pregnancy complicated by: GDM Delivery mode:  SVD Anticipated Birth Control:  Nexplanon PP Procedures needed: 2 hour GTT  Schedule Integrated BH visit: no  Newborn Data: Live born female  Birth Weight:   APGAR: 3, 9  Newborn Delivery   Birth date/time:  03/20/2018 22:11:00 Delivery type:  Vaginal, Spontaneous    Baby Feeding: Breast Disposition: to be determined, potentially rooming in or discharge home with mother  03/22/2018 Glenice Bow, DO

## 2018-03-21 LAB — STREP GP B NAA: Strep Gp B NAA: NEGATIVE

## 2018-03-21 LAB — GLUCOSE, CAPILLARY: Glucose-Capillary: 163 mg/dL — ABNORMAL HIGH (ref 70–99)

## 2018-03-21 LAB — RPR: RPR Ser Ql: NONREACTIVE

## 2018-03-21 MED ORDER — BISACODYL 10 MG RE SUPP: 10.0000 mg | Freq: Every day | RECTAL | Status: DC | PRN

## 2018-03-21 MED ORDER — BENZOCAINE-MENTHOL 20-0.5 % EX AERO: 1.0000 "application " | INHALATION_SPRAY | CUTANEOUS | Status: DC | PRN

## 2018-03-21 MED ORDER — ONDANSETRON HCL 4 MG PO TABS: 4.0000 mg | ORAL_TABLET | ORAL | Status: DC | PRN

## 2018-03-21 MED ORDER — TETANUS-DIPHTH-ACELL PERTUSSIS 5-2.5-18.5 LF-MCG/0.5 IM SUSP: 0.5000 mL | Freq: Once | INTRAMUSCULAR | Status: DC

## 2018-03-21 MED ORDER — ONDANSETRON HCL 4 MG/2ML IJ SOLN: 4.0000 mg | INTRAMUSCULAR | Status: DC | PRN

## 2018-03-21 MED ORDER — ACETAMINOPHEN 325 MG PO TABS
650.0000 mg | ORAL_TABLET | ORAL | Status: DC | PRN
  Administered 2018-03-21: 650 mg via ORAL

## 2018-03-21 MED ORDER — FLEET ENEMA 7-19 GM/118ML RE ENEM: 1.0000 | ENEMA | Freq: Every day | RECTAL | Status: DC | PRN

## 2018-03-21 MED ORDER — COCONUT OIL OIL: 1.0000 "application " | TOPICAL_OIL | Status: DC | PRN

## 2018-03-21 MED ORDER — SODIUM CHLORIDE 0.9 % IV SOLN: 250.0000 mL | INTRAVENOUS | Status: DC | PRN

## 2018-03-21 MED ORDER — ZOLPIDEM TARTRATE 5 MG PO TABS: 5.0000 mg | ORAL_TABLET | Freq: Every evening | ORAL | Status: DC | PRN

## 2018-03-21 MED ORDER — SIMETHICONE 80 MG PO CHEW: 80.0000 mg | CHEWABLE_TABLET | ORAL | Status: DC | PRN

## 2018-03-21 MED ORDER — SENNOSIDES-DOCUSATE SODIUM 8.6-50 MG PO TABS
2.0000 | ORAL_TABLET | ORAL | Status: DC
  Administered 2018-03-21: 2 via ORAL
  Filled 2018-03-21: qty 2

## 2018-03-21 MED ORDER — SODIUM CHLORIDE 0.9% FLUSH
3.0000 mL | Freq: Two times a day (BID) | INTRAVENOUS | Status: DC
  Administered 2018-03-21: 3 mL via INTRAVENOUS

## 2018-03-21 MED ORDER — PRENATAL MULTIVITAMIN CH
1.0000 | ORAL_TABLET | Freq: Every day | ORAL | Status: DC
  Administered 2018-03-21 – 2018-03-22 (×2): 1 via ORAL
  Filled 2018-03-21 (×2): qty 1

## 2018-03-21 MED ORDER — WITCH HAZEL-GLYCERIN EX PADS: 1.0000 "application " | MEDICATED_PAD | CUTANEOUS | Status: DC | PRN

## 2018-03-21 MED ORDER — RHO D IMMUNE GLOBULIN 1500 UNIT/2ML IJ SOSY
300.0000 ug | PREFILLED_SYRINGE | Freq: Once | INTRAMUSCULAR | Status: AC
  Administered 2018-03-21: 300 ug via INTRAVENOUS
  Filled 2018-03-21: qty 2

## 2018-03-21 MED ORDER — MEASLES, MUMPS & RUBELLA VAC IJ SOLR
0.5000 mL | Freq: Once | INTRAMUSCULAR | Status: DC
  Filled 2018-03-21: qty 0.5

## 2018-03-21 MED ORDER — DIPHENHYDRAMINE HCL 25 MG PO CAPS: 25.0000 mg | ORAL_CAPSULE | Freq: Four times a day (QID) | ORAL | Status: DC | PRN

## 2018-03-21 MED ORDER — DIBUCAINE 1 % RE OINT: 1.0000 "application " | TOPICAL_OINTMENT | RECTAL | Status: DC | PRN

## 2018-03-21 MED ORDER — IBUPROFEN 600 MG PO TABS
600.0000 mg | ORAL_TABLET | Freq: Four times a day (QID) | ORAL | Status: DC
  Administered 2018-03-21 – 2018-03-22 (×6): 600 mg via ORAL
  Filled 2018-03-21 (×6): qty 1

## 2018-03-21 MED ORDER — SODIUM CHLORIDE 0.9% FLUSH: 3.0000 mL | INTRAVENOUS | Status: DC | PRN

## 2018-03-21 NOTE — Progress Notes (Signed)
Admission nutrition screen triggered for unintentional weight loss > 10 lbs within the last month.Weights recorded in Epic indicate no weight loss  . Patients chart reviewed and assessed  for nutritional risk. Patient is determined to be at low nutrition  risk.

## 2018-03-21 NOTE — Progress Notes (Addendum)
Post Partum Day 1 Subjective: no complaints, up ad lib, voiding, tolerating PO and + flatus  Objective: Blood pressure 124/73, pulse 89, temperature 98.9 F (37.2 C), temperature source Oral, resp. rate 17, height 5\' 6"  (1.676 m), weight 111.6 kg, last menstrual period 07/03/2017, SpO2 98 %, unknown if currently breastfeeding.  Physical Exam:  General: alert, cooperative and appears stated age Lochia: appropriate Uterine Fundus: soft Incision: n/a DVT Evaluation: No evidence of DVT seen on physical exam. Negative Homan's sign. No cords or calf tenderness.  Recent Labs    03/20/18 1830  HGB 14.1  HCT 42.3    Assessment/Plan: Plan for discharge tomorrow and Breastfeeding   LOS: 1 day   Myrene Buddy 03/21/2018, 9:28 AM

## 2018-03-21 NOTE — Progress Notes (Signed)
Pt's IV noted to be infiltrated during giving of Rhogam. Immediately removed and ice pack applied to area. Patient stated area was painful. Swelling has decreased. Patient still complains of pain in the area.

## 2018-03-22 LAB — RH IG WORKUP (INCLUDES ABO/RH)
ABO/RH(D): O NEG
Fetal Screen: NEGATIVE
Gestational Age(Wks): 37
Unit division: 0

## 2018-03-22 MED ORDER — IBUPROFEN 800 MG PO TABS: 800.0000 mg | ORAL_TABLET | Freq: Three times a day (TID) | ORAL | 0 refills | Status: DC | PRN

## 2018-03-22 MED ORDER — SENNOSIDES-DOCUSATE SODIUM 8.6-50 MG PO TABS: 2.0000 | ORAL_TABLET | Freq: Every evening | ORAL | 0 refills | Status: DC | PRN

## 2018-03-27 ENCOUNTER — Encounter: Payer: Commercial Managed Care - PPO | Admitting: Obstetrics and Gynecology

## 2018-04-02 ENCOUNTER — Encounter: Payer: Commercial Managed Care - PPO | Admitting: Obstetrics and Gynecology

## 2018-04-19 ENCOUNTER — Ambulatory Visit: Payer: Commercial Managed Care - PPO | Admitting: Certified Nurse Midwife

## 2018-04-20 ENCOUNTER — Ambulatory Visit: Payer: Commercial Managed Care - PPO | Admitting: Obstetrics and Gynecology

## 2018-04-23 ENCOUNTER — Ambulatory Visit (INDEPENDENT_AMBULATORY_CARE_PROVIDER_SITE_OTHER): Payer: Commercial Managed Care - PPO

## 2018-04-23 DIAGNOSIS — Z3202 Encounter for pregnancy test, result negative: Secondary | ICD-10-CM

## 2018-04-23 DIAGNOSIS — Z30430 Encounter for insertion of intrauterine contraceptive device: Secondary | ICD-10-CM

## 2018-04-23 DIAGNOSIS — Z30014 Encounter for initial prescription of intrauterine contraceptive device: Secondary | ICD-10-CM

## 2018-04-23 DIAGNOSIS — Z01812 Encounter for preprocedural laboratory examination: Secondary | ICD-10-CM

## 2018-04-23 LAB — POCT URINE PREGNANCY: Preg Test, Ur: NEGATIVE

## 2018-04-23 MED ORDER — LEVONORGESTREL 20 MCG/24HR IU IUD
INTRAUTERINE_SYSTEM | Freq: Once | INTRAUTERINE | Status: AC
  Administered 2018-04-23: 12:00:00 via INTRAUTERINE

## 2018-04-23 NOTE — Patient Instructions (Signed)
IUD PLACEMENT POST-PROCEDURE INSTRUCTIONS  1. You may take Ibuprofen, Aleve or Tylenol for pain if needed.  Cramping should resolve within in 24 hours.  2. You may have a small amount of spotting.  You should wear a mini pad for the next few days.  3. You may have intercourse after 48 hours.  If you using this for birth control, it is effective immediately.  4. You need to call if you have any pelvic pain, fever, heavy bleeding or foul smelling vaginal discharge.  Irregular bleeding is common the first several months after having an IUD placed. You do not need to call for this reason unless you are concerned.  5. Shower or bathe as normal  6. You should have a follow-up appointment in 4-8 weeks for a re-check to make sure you are not having any problems.

## 2018-04-23 NOTE — Progress Notes (Signed)
Post Partum Exam  [name] is a 38 y.o. [id number] female who presents for a postpartum visit. She is 4 weeks postpartum following a spontaneous vaginal delivery. I have fully reviewed the prenatal and intrapartum course. The delivery was at 36.6 gestational weeks.  Anesthesia: none. Postpartum course has been doing well. Baby's course has been doing well. Baby is feeding by bottle - Similac Neosure. Bleeding no bleeding. Bowel function is normal. Bladder function is normal. Patient is not sexually active. Contraception method is abstinence.  Postpartum depression screening:neg, score 1.  The following portions of the patient's history were reviewed and updated as appropriate: allergies, current medications, past family history, past medical history, past social history, past surgical history and problem list. Last pap smear done 12/2016 and was Normal  Review of Systems A comprehensive review of systems was negative.    Objective:  Last menstrual period 07/03/2017, unknown if currently breastfeeding.  General:  alert, cooperative and no distress   Breasts:  Deferred  Lungs: clear to auscultation bilaterally  Heart:  regular rate and rhythm  Abdomen: soft, non-tender; bowel sounds normal; no masses,  no organomegaly   Vulva:  normal  Vagina: normal vagina, no discharge, exudate, lesion, or erythema  Cervix:  anteverted, no cervical motion tenderness and No discharge from os.  Corpus: anteverted, firm and Involuting  Adnexa:  no mass, fullness, tenderness  Rectal Exam: Normal rectovaginal exam        Assessment:   4 weeks postpartum exam S/p SVD without Lacerations Normal Involution Bottle Feeding Abstinence Desires IUD  Plan:   1.Discussed contraception methods by tiered approach 2.Contraception: IUD   *Mirena placed without incident, see insertion note for details. 3. Discussed initiation of sexual activity as desired 4. Discussed return to normal activities as  desired 5. Follow up in: 4 weeks for IUD recheck or as needed.

## 2018-04-23 NOTE — Progress Notes (Signed)
    GYNECOLOGY OFFICE PROCEDURE NOTE  [name] is a 38 y.o. [id number] here for Mirena IUD insertion. No GYN concerns.  Last pap smear was on 12/30/2016 and was normal.  IUD Insertion Procedure Note Patient identified, informed consent performed, consent signed.   Discussed risks of irregular bleeding, cramping, infection, malpositioning or misplacement of the IUD outside the uterus which may require further procedure such as laparoscopy. Time out was performed.  Urine pregnancy test negative.  Bimanual exam performed and revealed Anteflexed uterus; soft, NT. Speculum placed in the vagina allowing for full visualization of the cervix and vaginal vault, which was prepped with Betadine x 3.  The anterior lip was grasped with a single tooth tenaculum and the uterus was sounded to 10 cm.  Mirena IUD placed per manufacturer's recommendations.  Strings trimmed to ~3 cm. Tenaculum was removed, good hemostasis noted after pressure applied.  Patient tolerated the procedure well.   Patient was given post-procedure instructions.  She was advised to have backup contraception for one week and check the strings tomorrow night.  Patient was also asked to check IUD strings periodically and follow up in 4 weeks for IUD check.  Cherre Robins, CNM 04/23/2018

## 2018-04-26 ENCOUNTER — Other Ambulatory Visit: Payer: Commercial Managed Care - PPO

## 2018-04-26 DIAGNOSIS — Z8632 Personal history of gestational diabetes: Secondary | ICD-10-CM

## 2018-04-27 LAB — GLUCOSE TOLERANCE, 2 HOURS
Glucose, 2 hour: 119 mg/dL (ref 65–139)
Glucose, GTT - Fasting: 99 mg/dL (ref 65–99)

## 2018-05-10 ENCOUNTER — Telehealth: Payer: Self-pay

## 2018-05-10 NOTE — Progress Notes (Signed)
TC to pt requesting return to work note.

## 2018-05-10 NOTE — Telephone Encounter (Signed)
Return call to pt regarding  request for note for work. Link sent to pt email for Mychart.  Pt requested FMLA forms to be completed for Headaches  Not noted in PP notes Pt advised to F/U with PCP

## 2018-05-21 ENCOUNTER — Other Ambulatory Visit: Payer: Self-pay

## 2018-05-21 ENCOUNTER — Ambulatory Visit (INDEPENDENT_AMBULATORY_CARE_PROVIDER_SITE_OTHER): Payer: Commercial Managed Care - PPO | Admitting: Advanced Practice Midwife

## 2018-05-21 VITALS — BP 137/90 | HR 88 | Wt 227.0 lb

## 2018-05-21 DIAGNOSIS — Z30431 Encounter for routine checking of intrauterine contraceptive device: Secondary | ICD-10-CM | POA: Diagnosis not present

## 2018-05-21 DIAGNOSIS — Z975 Presence of (intrauterine) contraceptive device: Secondary | ICD-10-CM | POA: Diagnosis not present

## 2018-05-21 DIAGNOSIS — R519 Headache, unspecified: Secondary | ICD-10-CM

## 2018-05-21 DIAGNOSIS — R51 Headache: Secondary | ICD-10-CM

## 2018-05-21 MED ORDER — BUTALBITAL-APAP-CAFFEINE 50-325-40 MG PO TABS: 1.0000 | ORAL_TABLET | Freq: Four times a day (QID) | ORAL | 0 refills | Status: AC | PRN

## 2018-05-21 NOTE — Progress Notes (Signed)
  GYNECOLOGY CLINIC PROGRESS NOTE  History:  38 y.o. [id number] here today for today for IUD string check; Mirena IUD was placed  . No complaints about the Mirena, no concerning side effects.  The following portions of the patient's history were reviewed and updated as appropriate: allergies, current medications, past family history, past medical history, past social history, past surgical history and problem list. Last pap smear on 12/2016 was normal, negative HRHPV.  Review of Systems:  Pertinent items are noted in HPI.   Objective:  Physical Exam Last menstrual period 07/03/2017, unknown if currently breastfeeding. Gen: NAD Abd: Soft, nontender and nondistended Pelvic: Normal appearing external genitalia; normal appearing vaginal mucosa and cervix.  IUD strings visualized, about 3 cm in length outside cervix.   Assessment & Plan:    1. Nonintractable episodic headache, unspecified headache type --H/a are bilateral, intermittent, pressure with some visual changes but no photophobia or n/v.  Some migraine component but not clear.   --Continue ibuprofen 600 mg at early onset of headache while working.  Increase PO fluids.   --BP 137/90 today, slightly abnormal, no other symptoms --Pt to f/u with primary care as soon as possible - AMB referral to headache clinic - butalbital-acetaminophen-caffeine (FIORICET, ESGIC) 50-325-40 MG tablet; Take 1-2 tablets by mouth every 6 (six) hours as needed for headache.  Dispense: 20 tablet; Refill: 0  2. IUD check up --Normal IUD check. --Patient to keep IUD in place for five years; can come in for removal if she desires pregnancy within the next five years. --Routine preventative health maintenance measures emphasized.  Sharen Counter, CNM 12:03 PM

## 2018-06-24 IMAGING — US US MFM OB FOLLOW-UP
1 series · 14 of 28 positions shown · non-contrast
Comparison: none

[Series 1: us mfm ob follow-up · 51 acquisitions, 14 frames shown]
[im 2/51]
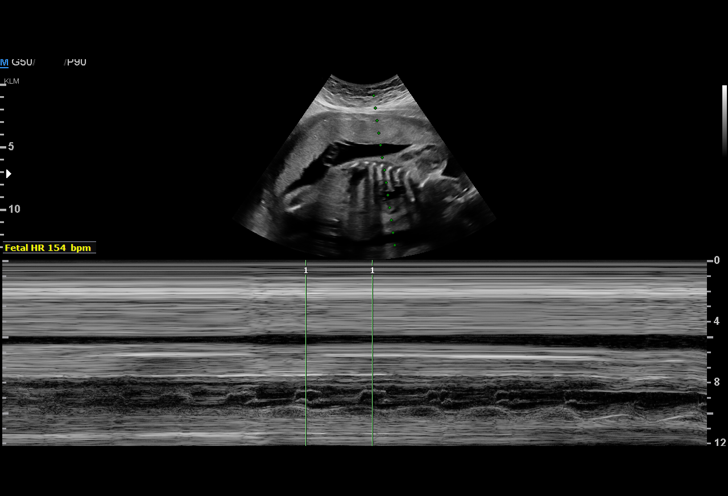
[im 6/51]
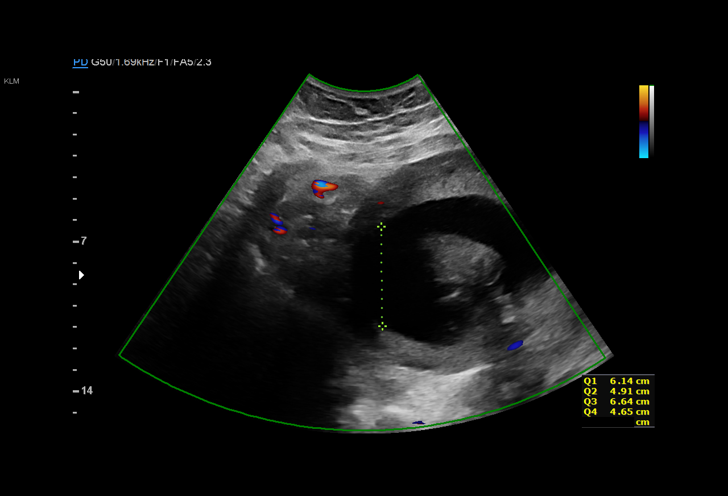
[im 10/51]
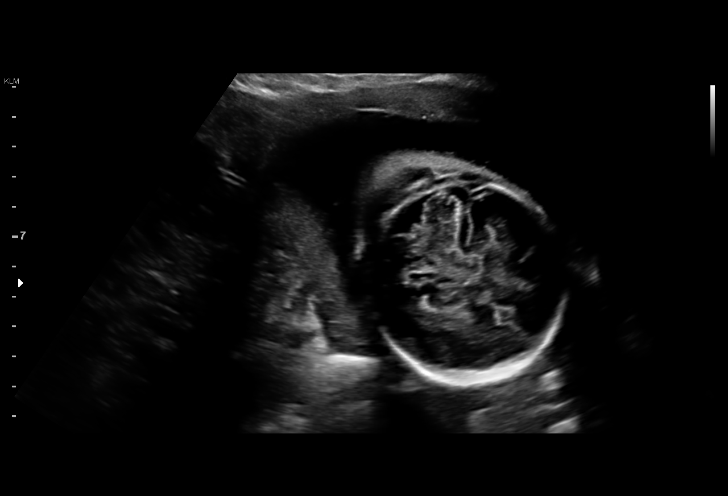
[im 13/51]
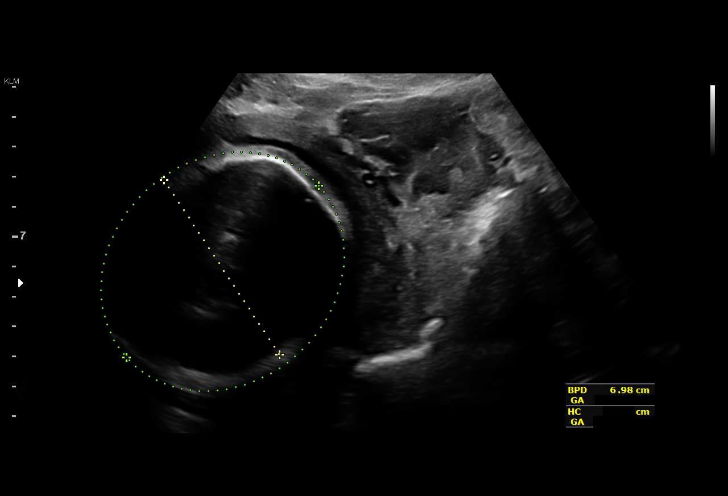
[im 17/51]
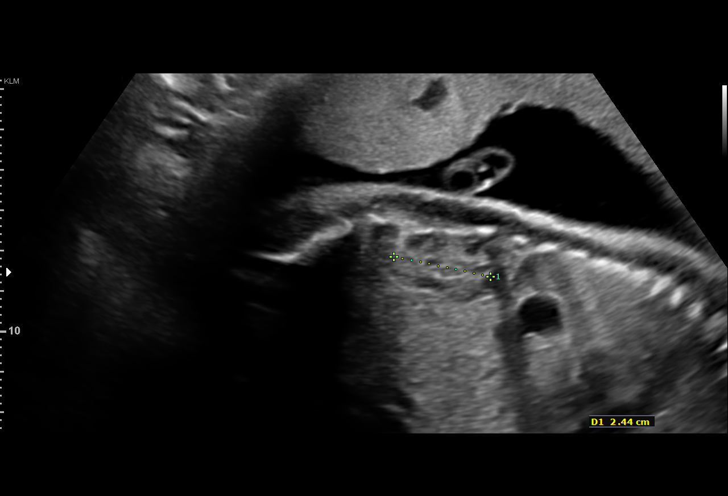
[im 21/51]
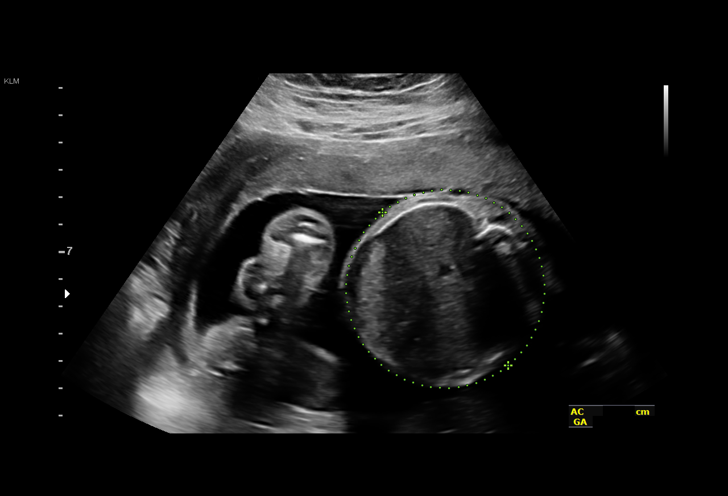
[im 25/51]
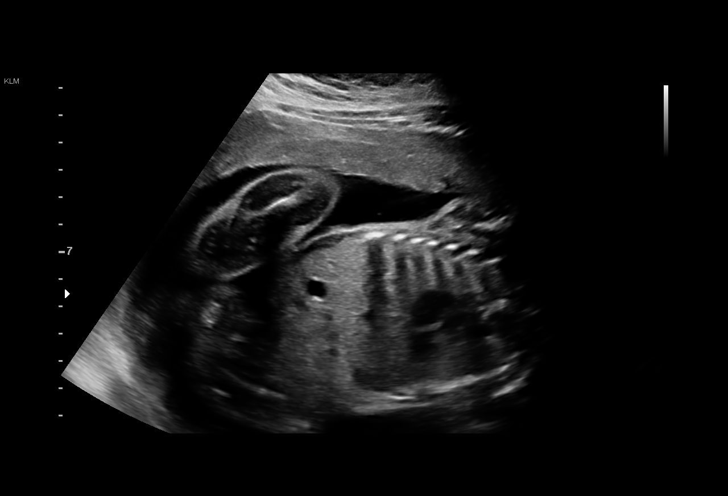
[im 28/51]
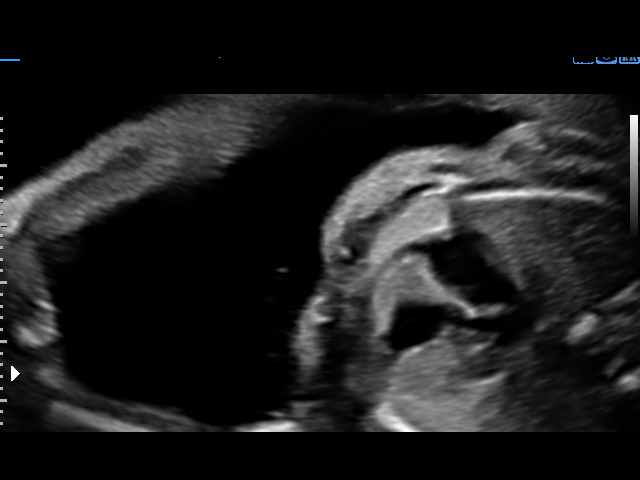
[im 32/51]
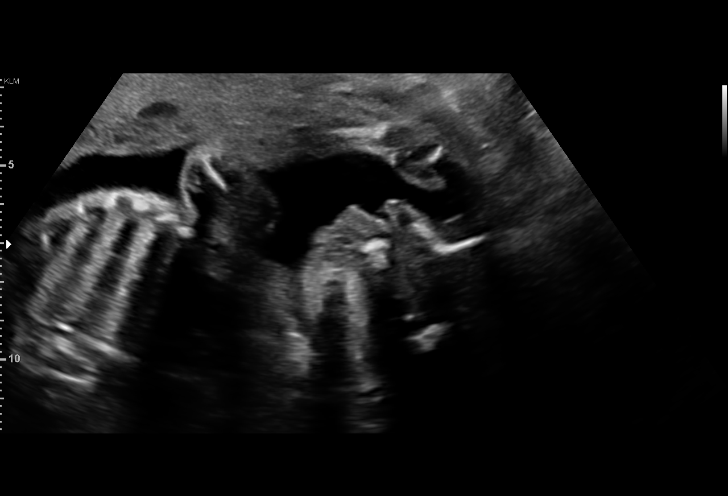
[im 36/51]
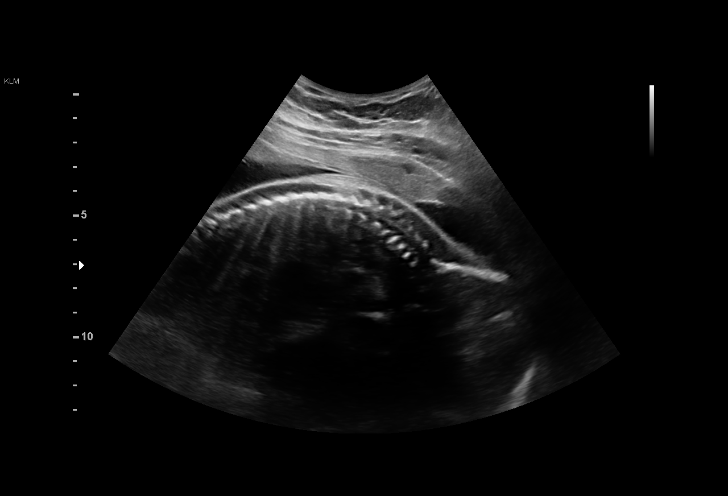
[im 39/51]
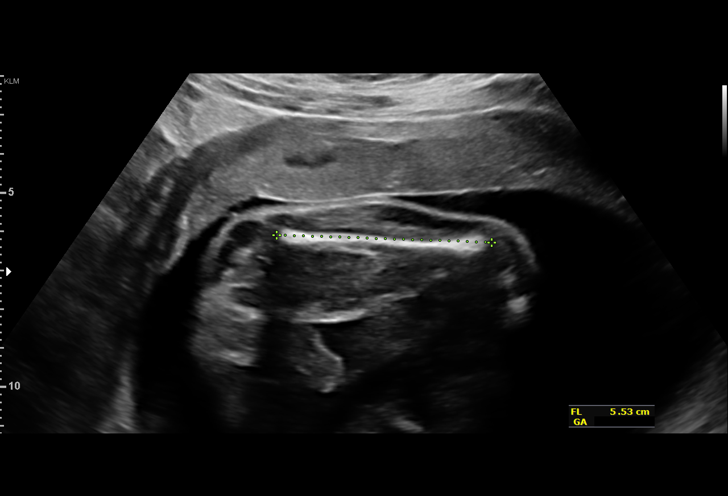
[im 43/51]
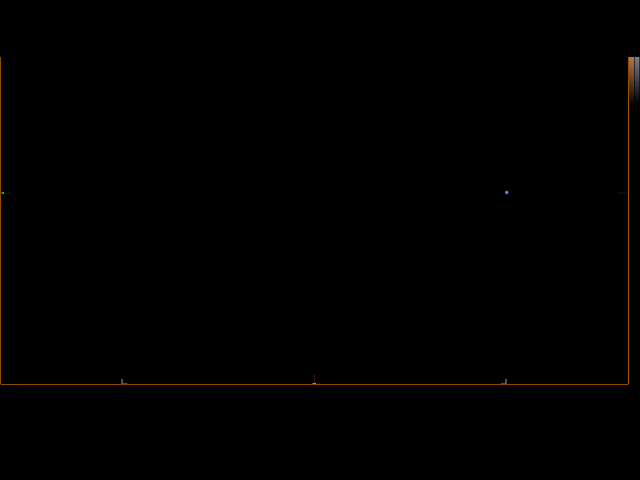
[im 47/51]
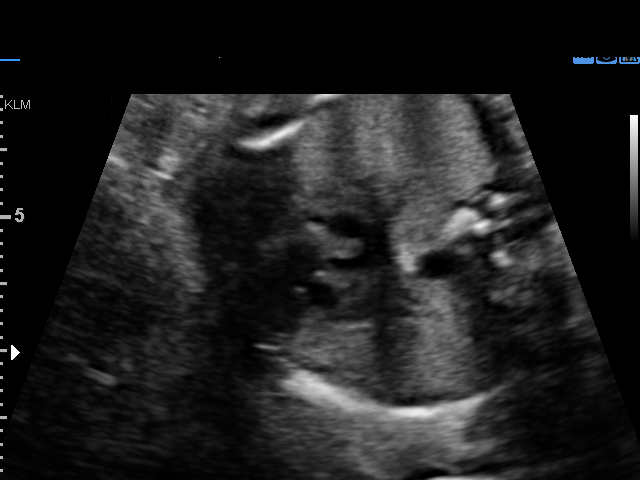
[im 51/51]
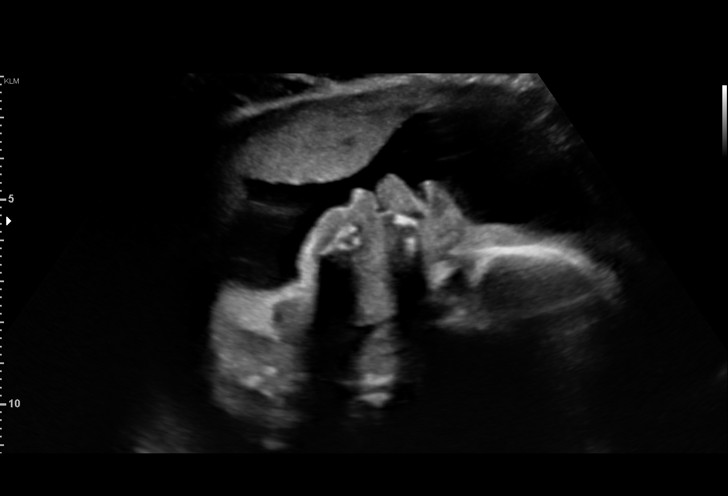

[14 of 28 positions shown; findings below may reference images not displayed]

Road [HOSPITAL]

Indications

28 weeks gestation of pregnancy
Fetal cardiac anomaly affecting pregnancy,
antepartum; HLHS
Obesity complicating pregnancy, third
trimester
Smoking complicating pregnancy, third
trimester
OB History

Blood Type:            Height:  5'6"   Weight (lb):  209       BMI:
Gravidity:    14        Term:   2        Prem:   1        SAB:   0
TOP:          0       Ectopic:  0        Living: 3
Fetal Evaluation

Num Of Fetuses:     1
Fetal Heart         154
Rate(bpm):
Cardiac Activity:   Observed
Presentation:       Cephalic
Placenta:           Anterior, above cervical os
P. Cord Insertion:  Previously Visualized

Amniotic Fluid
AFI FV:      Subjectively within normal limits

AFI Sum(cm)     %Tile       Largest Pocket(cm)
22.34           92

RUQ(cm)       RLQ(cm)       LUQ(cm)        LLQ(cm)
6.14
Biometry

BPD:      68.7  mm     G. Age:  27w 4d         26  %    CI:        74.88   %    70 - 86
FL/HC:      21.4   %    18.8 -
HC:      251.9  mm     G. Age:  27w 3d          8  %    HC/AC:      1.08        1.05 -
AC:       234   mm     G. Age:  27w 5d         35  %    FL/BPD:     78.6   %    71 - 87
FL:         54  mm     G. Age:  28w 4d         53  %    FL/AC:      23.1   %    20 - 24

Est. FW:    1170  gm      2 lb 9 oz     51  %
Gestational Age

LMP:           28w 0d        Date:  09/27/16                 EDD:   07/04/17
U/S Today:     27w 6d                                        EDD:   07/05/17
Best:          28w 0d     Det. By:  LMP  (09/27/16)          EDD:   07/04/17
Anatomy

Cranium:               Appears normal         Aortic Arch:            Not well visualized
Cavum:                 Previously seen        Ductal Arch:            Previously seen
Ventricles:            Appears normal         Diaphragm:              Appears normal
Choroid Plexus:        Previously seen        Stomach:                Appears normal, left
sided
Cerebellum:            Previously seen        Abdomen:                Appears normal
Posterior Fossa:       Previously seen        Abdominal Wall:         Previously seen
Nuchal Fold:           Not applicable (>20    Cord Vessels:           Previously seen
wks GA)
Face:                  Orbits and profile     Kidneys:                Appear normal
previously seen
Lips:                  Previously seen        Bladder:                Appears normal
Thoracic:              Appears normal         Spine:                  Previously seen
Heart:                 Abnormal, see          Upper Extremities:      Previously seen
comments
RVOT:                  Abnormal, see          Lower Extremities:      Previously seen
comments
LVOT:                  Abnormal, see
comments

Other:  Fetus appears to be a male. Nasal bone previously visualized.
Technically difficult due to fetal position.
Cervix Uterus Adnexa

Cervix
Length:            3.6  cm.
Normal appearance by transabdominal scan.
Impression

Singleton intrauterine pregnancy at 28 weeks 0 days
gestation with fetal cardiac activity
Cephalic presentation
Hypoplastic left heart syndrome
All other interval fetal anatomy was seen and appeared
normal
Normal amniotic fluid volume
Appropriate interval growth with EFW at the 51st %tile
Anterior placenta
Normal appearing cervical length
Recommendations

Follow-up ultrasound for growth in 4 weeks
Plan: deliver at [place]

## 2018-07-12 ENCOUNTER — Ambulatory Visit: Payer: Commercial Managed Care - PPO | Admitting: Certified Nurse Midwife

## 2018-07-13 ENCOUNTER — Institutional Professional Consult (permissible substitution): Payer: Commercial Managed Care - PPO | Admitting: Physician Assistant

## 2018-07-13 DIAGNOSIS — R51 Headache: Secondary | ICD-10-CM

## 2018-12-24 ENCOUNTER — Other Ambulatory Visit: Payer: Self-pay | Admitting: *Deleted

## 2018-12-24 DIAGNOSIS — Z20822 Contact with and (suspected) exposure to covid-19: Secondary | ICD-10-CM

## 2018-12-26 LAB — NOVEL CORONAVIRUS, NAA: SARS-CoV-2, NAA: NOT DETECTED

## 2019-01-18 ENCOUNTER — Emergency Department (HOSPITAL_BASED_OUTPATIENT_CLINIC_OR_DEPARTMENT_OTHER)
Admission: EM | Admit: 2019-01-18 | Discharge: 2019-01-18 | Discharge status: Against Medical Advice | Payer: Commercial Managed Care - PPO

## 2019-01-26 IMAGING — US US MFM OB DETAIL+14 WK
1 series · 13 of 28 positions shown · non-contrast
Comparison: none

[Series 1: us mfm ob detail+14 wk · 13 of 73 slices shown]
[im 3/73]
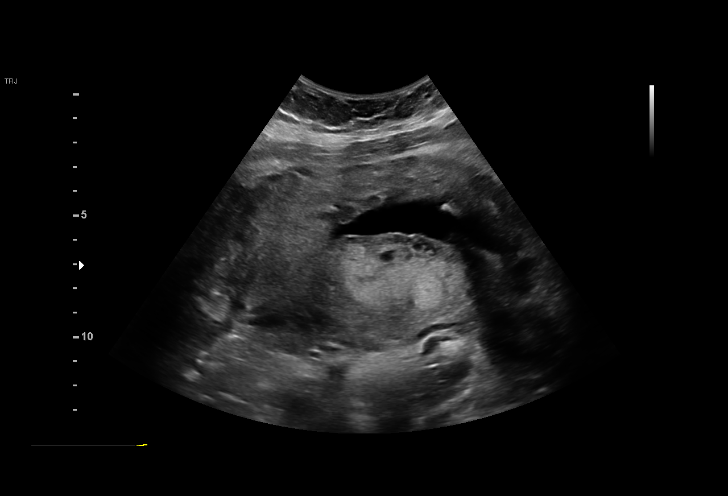
[im 9/73]
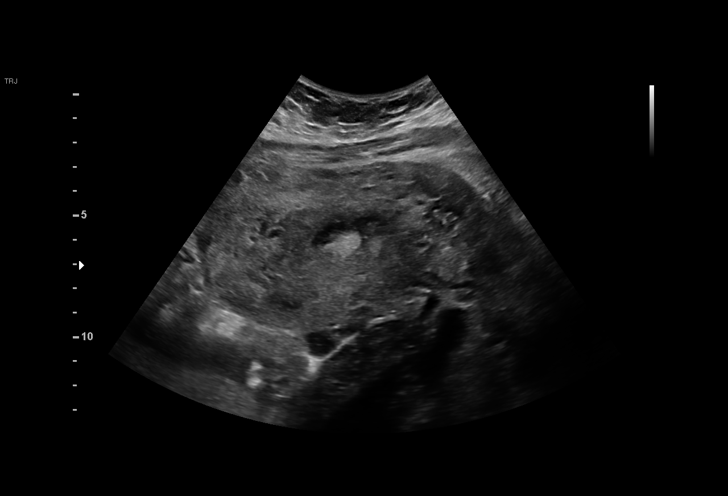
[im 14/73]
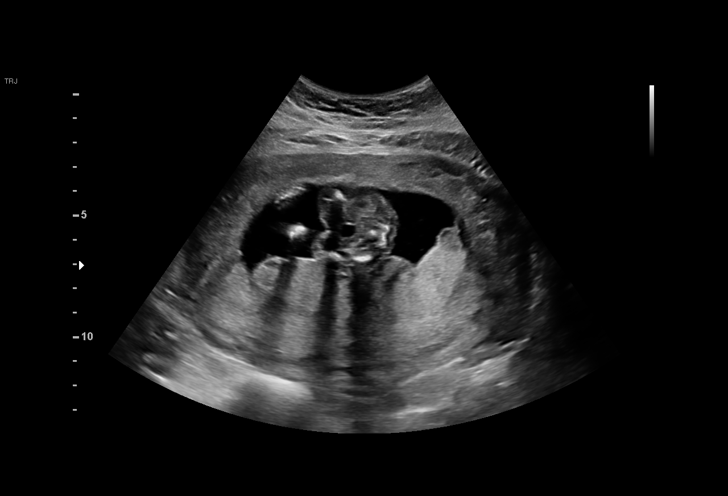
[im 19/73]
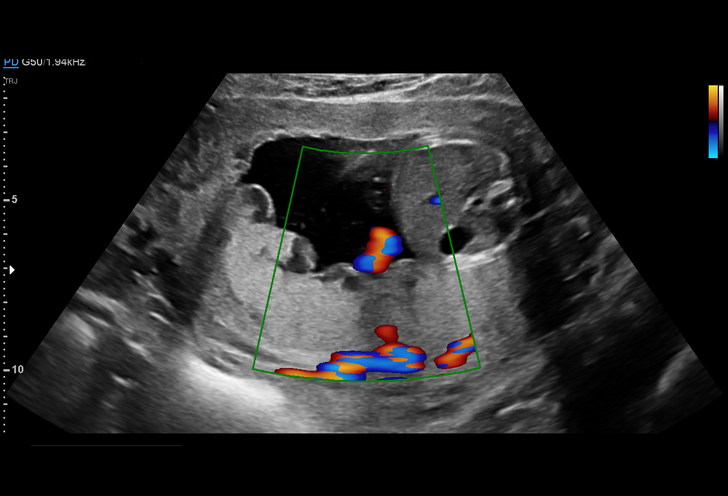
[im 25/73]
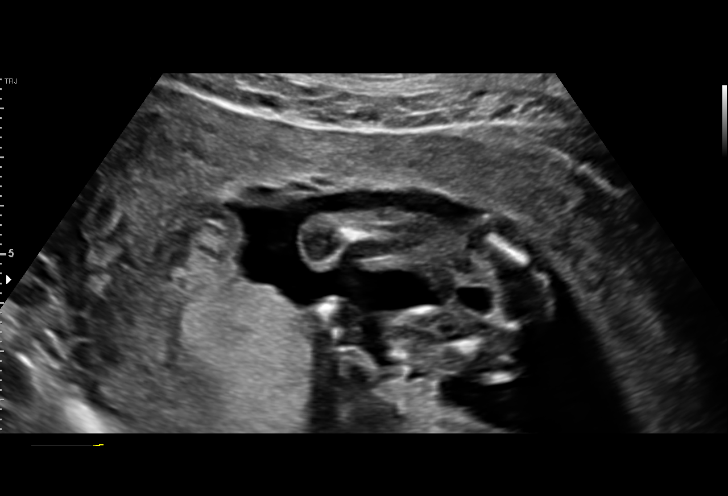
[im 30/73]
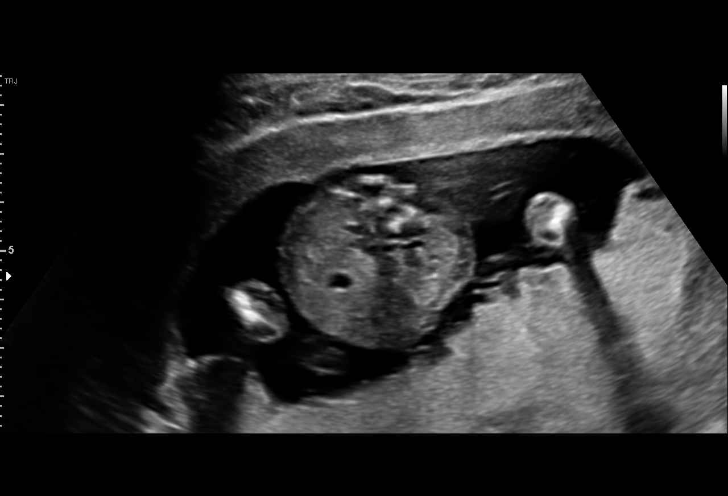
[im 38/73]
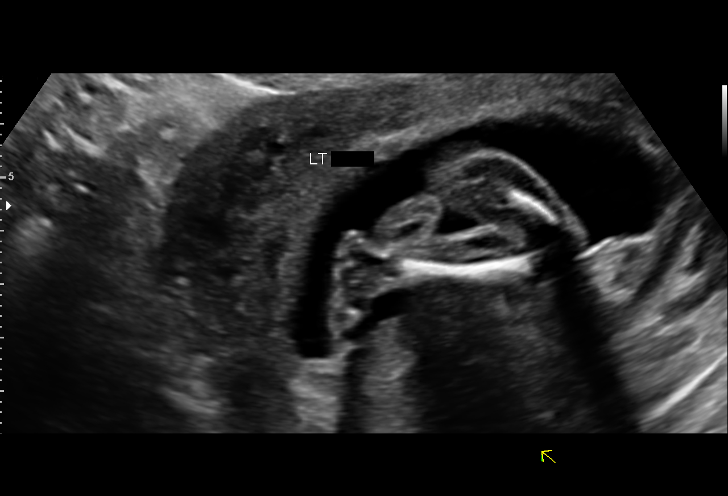
[im 43/73]
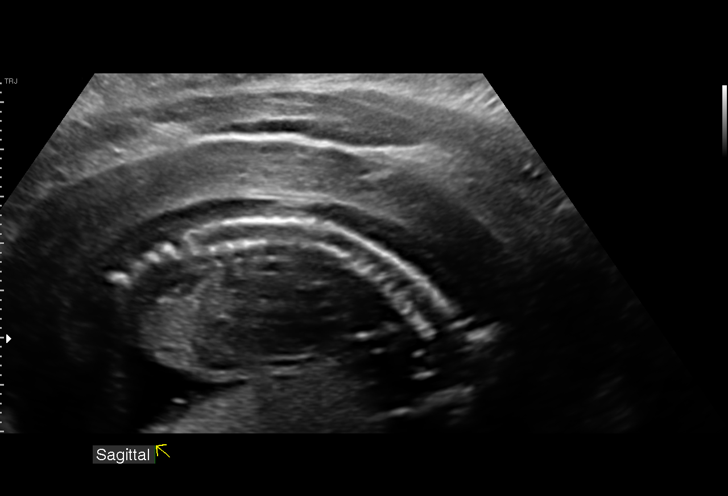
[im 49/73]
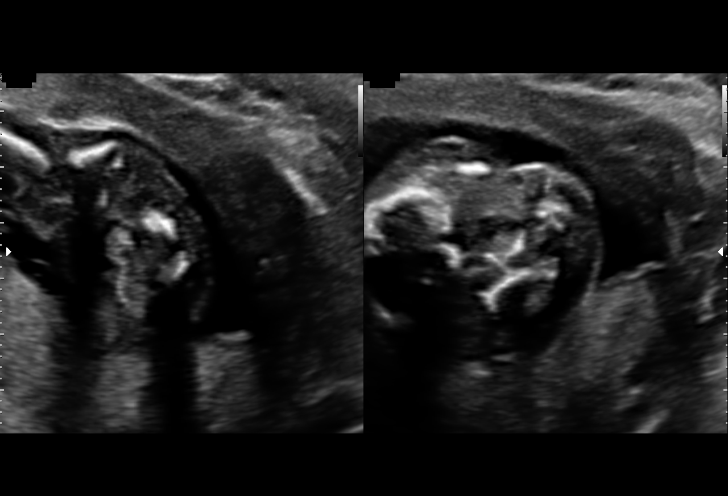
[im 54/73]
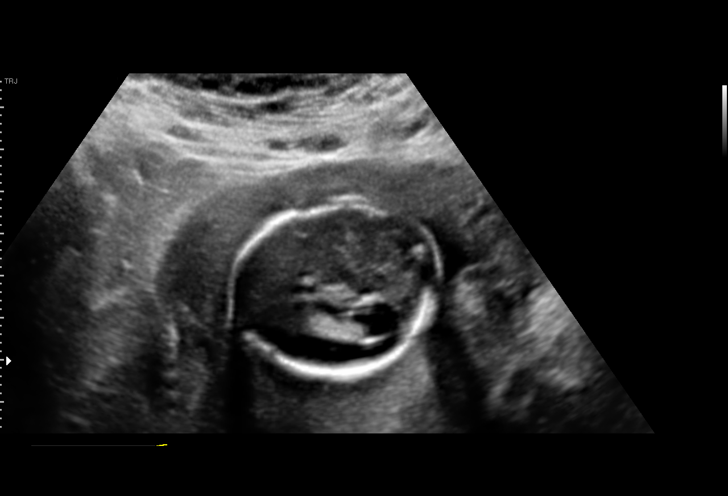
[im 59/73]
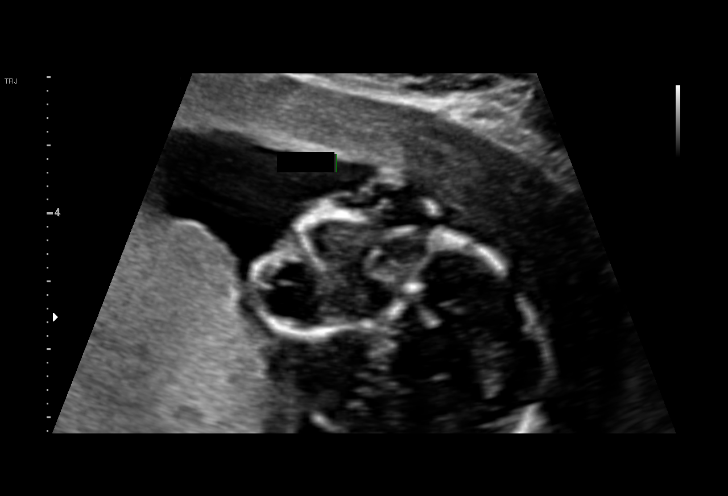
[im 65/73]
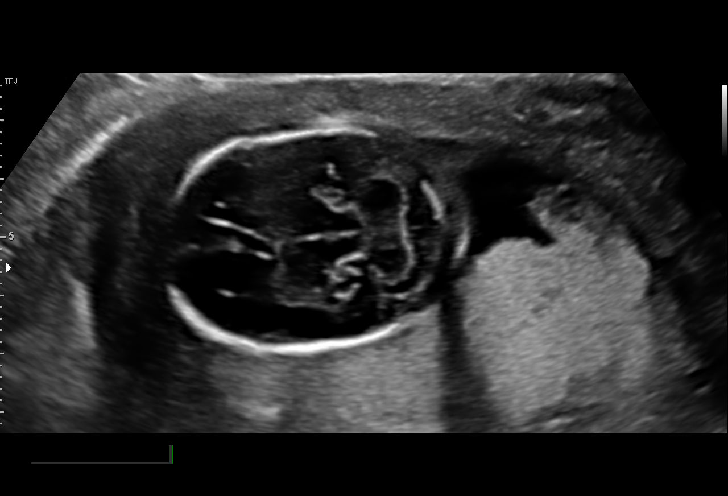
[im 70/73]
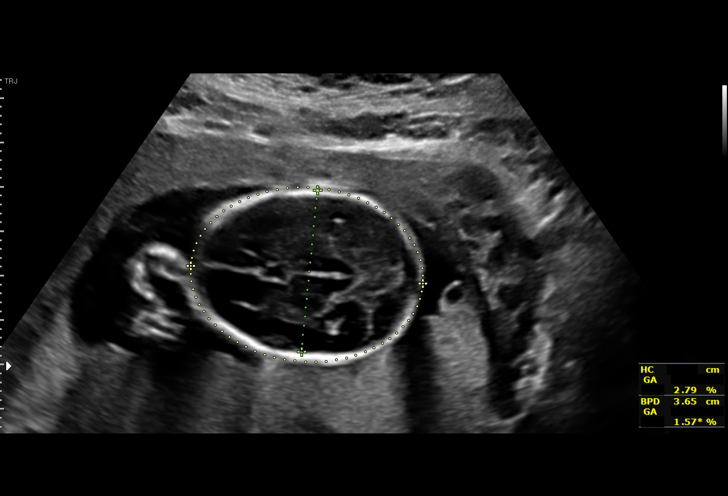

[13 of 28 positions shown; findings below may reference images not displayed]

Indications

Encounter for antenatal screening,
unspecified
Gestational diabetes in pregnancy, diet
controlled
Poor obstetric history: Previous IUFD 30
weeks
Previous pregnacy with congenital heart
(cardiac) defect- [facility]
Rh negative state in antepartum
NIPS low risk; AFP screen negative
19 weeks gestation of pregnancy
Fetal Evaluation

Num Of Fetuses:         1
Cardiac Activity:       Observed
Presentation:           Cephalic
Placenta:               Posterior
P. Cord Insertion:      Visualized, central

Amniotic Fluid
AFI FV:      Within normal limits

Largest Pocket(cm)
4.28
Biometry

BPD:      36.8  mm     G. Age:  17w 2d          2  %    CI:        65.64   %    70 - 86
FL/HC:      18.4   %    16.1 -
HC:      145.8  mm     G. Age:  17w 5d          4  %    HC/AC:      1.22        1.09 -
AC:      119.3  mm     G. Age:  17w 4d         10  %    FL/BPD:     72.8   %
FL:       26.8  mm     G. Age:  18w 1d         18  %    FL/AC:      22.5   %    20 - 24
CER:      18.9  mm     G. Age:  18w 3d         34  %
NFT:       3.5  mm
LV:        5.8  mm
CM:        2.5  mm

Est. FW:     213  gm      0 lb 8 oz   < 25  %
OB History

Gravidity:    15        Term:   2        Prem:   2        SAB:   1
TOP:          9       Ectopic:  0        Living: 3
Gestational Age

LMP:           19w 0d        Date:  07/03/17                 EDD:   04/09/18
U/S Today:     17w 5d                                        EDD:   04/18/18
Best:          19w 0d     Det. By:  LMP  (07/03/17)          EDD:   04/09/18
Anatomy

Cranium:               Appears normal         Aortic Arch:            Not well visualized
Cavum:                 Appears normal         Ductal Arch:            Not well visualized
Ventricles:            Appears normal         Diaphragm:              Appears normal
Choroid Plexus:        Appears normal         Stomach:                Appears normal, left
sided
Cerebellum:            Appears normal         Abdomen:                Appears normal
Posterior Fossa:       Appears normal         Abdominal Wall:         Appears nml (cord
insert, abd wall)
Nuchal Fold:           Appears normal         Cord Vessels:           Appears normal (3
vessel cord)
Face:                  Appears normal         Kidneys:                Appear normal
(orbits and profile)
Lips:                  Appears normal         Bladder:                Appears normal
Thoracic:              Appears normal         Spine:                  Appears normal
Heart:                 Appears normal         Upper Extremities:      Appears normal
(4CH, axis, and situs
RVOT:                  Not well visualized    Lower Extremities:      Appears normal
LVOT:                  Appears normal

Other:  Nasal bone visualized. Heels visualized. Open hand visualized. Fetus
appears to be female. Technically difficult due to fetal position.
Cervix Uterus Adnexa

Cervix
Length:           3.94  cm.
Normal appearance by transabdominal scan.

Left Ovary
Size(cm)       4.2  x   2.1    x  2.2       Vol(ml):
Within normal limits.

Right Ovary
Size(cm)       3.5  x   1.8    x  2.3       Vol(ml):
Within normal limits.
Impression

Patient with history of previous child affected with hypoplastic
left heart syndrome (fetal death) is here for fetal anatomy
scan.
She has gestational diabetes and has just received nutrition
consultation. She has started self-monitoring of blood
glucose.

On cell-free fetal DNA screening, the risks of fetal
aneuploidies are not increased. MSAFP screening showed
low risk for open-neural tube defects.

We performed fetal anatomy scan. No makers of
aneuploidies or fetal structural defects are seen. Fetal
biometry is consistent with her previously-established dates.
Amniotic fluid is normal and good fetal activity is seen.
Recommendations

-Patient has an appointment for fetal echocardiography at
[HOSPITAL] next week.
-An appointment was made for her to return in 4 weeks for
fetal growth assessment and completion of fetal anatomy.

## 2019-05-14 DIAGNOSIS — R519 Headache, unspecified: Secondary | ICD-10-CM | POA: Diagnosis not present

## 2019-05-14 DIAGNOSIS — E663 Overweight: Secondary | ICD-10-CM | POA: Diagnosis not present

## 2019-05-27 DIAGNOSIS — H04123 Dry eye syndrome of bilateral lacrimal glands: Secondary | ICD-10-CM | POA: Diagnosis not present

## 2019-05-27 DIAGNOSIS — H02206 Unspecified lagophthalmos left eye, unspecified eyelid: Secondary | ICD-10-CM | POA: Diagnosis not present

## 2019-05-27 DIAGNOSIS — H02203 Unspecified lagophthalmos right eye, unspecified eyelid: Secondary | ICD-10-CM | POA: Diagnosis not present

## 2019-05-27 DIAGNOSIS — H0288A Meibomian gland dysfunction right eye, upper and lower eyelids: Secondary | ICD-10-CM | POA: Diagnosis not present

## 2019-05-27 DIAGNOSIS — H0288B Meibomian gland dysfunction left eye, upper and lower eyelids: Secondary | ICD-10-CM | POA: Diagnosis not present

## 2019-05-27 DIAGNOSIS — H5010 Unspecified exotropia: Secondary | ICD-10-CM | POA: Diagnosis not present

## 2019-11-05 DIAGNOSIS — E663 Overweight: Secondary | ICD-10-CM | POA: Diagnosis not present

## 2019-11-05 DIAGNOSIS — R519 Headache, unspecified: Secondary | ICD-10-CM | POA: Diagnosis not present

## 2019-11-05 DIAGNOSIS — L02429 Furuncle of limb, unspecified: Secondary | ICD-10-CM | POA: Diagnosis not present

## 2020-01-02 DIAGNOSIS — R42 Dizziness and giddiness: Secondary | ICD-10-CM | POA: Diagnosis not present

## 2020-01-02 DIAGNOSIS — R030 Elevated blood-pressure reading, without diagnosis of hypertension: Secondary | ICD-10-CM | POA: Diagnosis not present

## 2020-01-02 DIAGNOSIS — R519 Headache, unspecified: Secondary | ICD-10-CM | POA: Diagnosis not present

## 2020-01-02 DIAGNOSIS — E663 Overweight: Secondary | ICD-10-CM | POA: Diagnosis not present

## 2020-01-13 DIAGNOSIS — E663 Overweight: Secondary | ICD-10-CM | POA: Diagnosis not present

## 2020-01-13 DIAGNOSIS — M545 Low back pain, unspecified: Secondary | ICD-10-CM | POA: Diagnosis not present

## 2020-05-25 ENCOUNTER — Ambulatory Visit (HOSPITAL_COMMUNITY)
Admission: EM | Admit: 2020-05-25 | Discharge: 2020-05-25 | Disposition: A | Discharge status: Home / Self Care | Payer: Commercial Managed Care - PPO

## 2020-05-25 ENCOUNTER — Other Ambulatory Visit: Payer: Self-pay

## 2020-05-25 ENCOUNTER — Encounter (HOSPITAL_COMMUNITY): Payer: Self-pay | Admitting: Pharmacy Technician

## 2020-05-25 ENCOUNTER — Emergency Department (HOSPITAL_COMMUNITY)
Admission: EM | Admit: 2020-05-25 | Discharge: 2020-05-26 | Disposition: A | Discharge status: Home / Self Care | Payer: Commercial Managed Care - PPO | Attending: Emergency Medicine | Admitting: Emergency Medicine

## 2020-05-25 ENCOUNTER — Encounter (HOSPITAL_COMMUNITY): Payer: Self-pay

## 2020-05-25 DIAGNOSIS — Z9089 Acquired absence of other organs: Secondary | ICD-10-CM

## 2020-05-25 DIAGNOSIS — J039 Acute tonsillitis, unspecified: Secondary | ICD-10-CM | POA: Diagnosis not present

## 2020-05-25 DIAGNOSIS — Z87891 Personal history of nicotine dependence: Secondary | ICD-10-CM | POA: Diagnosis not present

## 2020-05-25 DIAGNOSIS — J36 Peritonsillar abscess: Secondary | ICD-10-CM

## 2020-05-25 DIAGNOSIS — R221 Localized swelling, mass and lump, neck: Secondary | ICD-10-CM | POA: Diagnosis not present

## 2020-05-25 DIAGNOSIS — R590 Localized enlarged lymph nodes: Secondary | ICD-10-CM | POA: Diagnosis not present

## 2020-05-25 DIAGNOSIS — J351 Hypertrophy of tonsils: Secondary | ICD-10-CM | POA: Diagnosis not present

## 2020-05-25 DIAGNOSIS — R509 Fever, unspecified: Secondary | ICD-10-CM | POA: Diagnosis present

## 2020-05-25 MED ORDER — MORPHINE SULFATE (PF) 4 MG/ML IV SOLN
4.0000 mg | Freq: Once | INTRAVENOUS | Status: AC
  Administered 2020-05-26: 4 mg via INTRAVENOUS
  Filled 2020-05-25: qty 1

## 2020-05-25 MED ORDER — ONDANSETRON HCL 4 MG/2ML IJ SOLN
4.0000 mg | Freq: Once | INTRAMUSCULAR | Status: AC
  Administered 2020-05-26: 4 mg via INTRAVENOUS
  Filled 2020-05-25: qty 2

## 2020-05-25 NOTE — ED Triage Notes (Signed)
Pt in with c/o ST that has been going on for 3 days now  States she has tonsils and 1 adenoid removed years ago   Pt has not had medication for sxs

## 2020-05-25 NOTE — Discharge Instructions (Addendum)
-  Head straight to [facility] ED for further evaluation of peritonsillar abscess.  -If you experience dizziness, shortness of breath, chest pain, loss of consciousness, etc while on the way to the ED- stop and call 911.

## 2020-05-25 NOTE — ED Notes (Signed)
Patient is being discharged from the Urgent Care and sent to the Emergency Department via pov. Per Ignacia Bayley, PA, patient is in need of higher level of care due to tonsillar abscess . Patient is aware and verbalizes understanding of plan of care.  Vitals:   05/25/20 1358  BP: 124/79  Pulse: 90  Resp: 17  Temp: 99.2 F (37.3 C)  SpO2: 100%

## 2020-05-25 NOTE — ED Provider Notes (Addendum)
[facility]    CSN: [id number] Arrival date & time: 05/25/20  1314      History   Chief Complaint Chief Complaint  Patient presents with  . Sore Throat  . Chills    HPI [name] is a 40 y.o. female presenting with sore throat and chills x3 days getting worse. History tonsillectomy 5 years ago related to abscess, per pt. Denies any throat issues since then. Notes difficulty swallowing that's getting worse. Denies shortness of breath. Denies other URI symptoms including cough, congestion, n/v/d.   HPI  Past Medical History:  Diagnosis Date  . Anemia   . Gestational diabetes     Patient Active Problem List   Diagnosis Date Noted  . IUD (intrauterine device) in place 05/21/2018  . Low vitamin D level 01/04/2017  . History of preterm delivery 12/30/2016    Past Surgical History:  Procedure Laterality Date  . TONSILLECTOMY  2017    OB History    Gravida  15   Para  5   Term  2   Preterm  3   AB  10   Living  4     SAB  1   IAB  9   Ectopic      Multiple  0   Live Births  4            Home Medications    Prior to Admission medications   Medication Sig Start Date End Date Taking? Authorizing Provider  ibuprofen (ADVIL,MOTRIN) 800 MG tablet Take 1 tablet (800 mg total) by mouth every 8 (eight) hours as needed. 03/22/18   Tamera Stands, DO  prenatal vitamin w/FE, FA (PRENATAL 1 + 1) 27-1 MG TABS tablet Take 1 tablet by mouth daily at 12 noon. 03/10/17   Brock Bad, MD  senna-docusate (SENOKOT-S) 8.6-50 MG tablet Take 2 tablets by mouth at bedtime as needed for mild constipation. 03/22/18   Tamera Stands, DO  Vitamin D, Ergocalciferol, (DRISDOL) 50000 units CAPS capsule Take 1 capsule (50,000 Units total) by mouth every 7 (seven) days. 09/14/17   Roe Coombs, CNM    Family History Family History  Problem Relation Age of Onset  . Hyperlipidemia Mother     Social History Social History   Tobacco Use  .  Smoking status: Former Smoker    Types: Cigarettes    Quit date: 2018    Years since quitting: 4.2  . Smokeless tobacco: Never Used  Vaping Use  . Vaping Use: Never used  Substance Use Topics  . Alcohol use: No    Comment: occ  . Drug use: No     Allergies   Patient has no known allergies.   Review of Systems Review of Systems  Constitutional: Negative for appetite change, chills and fever.  HENT: Positive for sore throat and trouble swallowing. Negative for congestion, drooling, ear pain, rhinorrhea, sinus pressure, sinus pain and voice change.   Eyes: Negative for redness and visual disturbance.  Respiratory: Negative for cough, chest tightness, shortness of breath and wheezing.   Cardiovascular: Negative for chest pain and palpitations.  Gastrointestinal: Negative for abdominal pain, constipation, diarrhea, nausea and vomiting.  Genitourinary: Negative for dysuria, frequency and urgency.  Musculoskeletal: Negative for myalgias.  Neurological: Negative for dizziness, weakness and headaches.  Psychiatric/Behavioral: Negative for confusion.  All other systems reviewed and are negative.    Physical Exam Triage Vital Signs ED Triage Vitals  Enc Vitals Group  BP 05/25/20 1358 124/79     Pulse Rate 05/25/20 1358 90     Resp 05/25/20 1358 17     Temp 05/25/20 1358 99.2 F (37.3 C)     Temp src --      SpO2 05/25/20 1358 100 %     Weight --      Height --      Head Circumference --      Peak Flow --      Pain Score 05/25/20 1357 8     Pain Loc --      Pain Edu? --      Excl. in GC? --    No data found.  Updated Vital Signs BP 124/79   Pulse 90   Temp 99.2 F (37.3 C)   Resp 17   LMP 05/03/2020 (Approximate)   SpO2 100%   Breastfeeding No   Visual Acuity Right Eye Distance:   Left Eye Distance:   Bilateral Distance:    Right Eye Near:   Left Eye Near:    Bilateral Near:     Physical Exam Vitals reviewed.  Constitutional:      General: She is  not in acute distress.    Appearance: Normal appearance. She is not ill-appearing.  HENT:     Head: Normocephalic and atraumatic.     Right Ear: Hearing, tympanic membrane, ear canal and external ear normal. No swelling or tenderness. There is no impacted cerumen. No mastoid tenderness. Tympanic membrane is not perforated, erythematous, retracted or bulging.     Left Ear: Hearing, tympanic membrane, ear canal and external ear normal. No swelling or tenderness. There is no impacted cerumen. No mastoid tenderness. Tympanic membrane is not perforated, erythematous, retracted or bulging.     Nose:     Right Sinus: No maxillary sinus tenderness or frontal sinus tenderness.     Left Sinus: No maxillary sinus tenderness or frontal sinus tenderness.     Mouth/Throat:     Mouth: Mucous membranes are moist.     Pharynx: Posterior oropharyngeal erythema present. No oropharyngeal exudate.     Tonsils: No tonsillar exudate. 0 on the right. 0 on the left.     Comments: L peritonsillar abscess- see image below  Tonsils not present bilaterally Cardiovascular:     Rate and Rhythm: Normal rate and regular rhythm.     Heart sounds: Normal heart sounds.  Pulmonary:     Breath sounds: Normal breath sounds and air entry. No wheezing, rhonchi or rales.  Chest:     Chest wall: No tenderness.  Abdominal:     General: Abdomen is flat. Bowel sounds are normal.     Tenderness: There is no abdominal tenderness. There is no guarding or rebound.  Lymphadenopathy:     Cervical: No cervical adenopathy.  Neurological:     General: No focal deficit present.     Mental Status: She is alert and oriented to person, place, and time.  Psychiatric:        Attention and Perception: Attention and perception normal.        Mood and Affect: Mood and affect normal.        Behavior: Behavior normal. Behavior is cooperative.        Thought Content: Thought content normal.        Judgment: Judgment normal.         UC  Treatments / Results  Labs (all labs ordered are listed, but only abnormal results are displayed) Labs  Reviewed - No data to display  EKG   Radiology No results found.  Procedures Procedures (including critical care time)  Medications Ordered in UC Medications - No data to display  Initial Impression / Assessment and Plan / UC Course  I have reviewed the triage vital signs and the nursing notes.  Pertinent labs & imaging results that were available during my care of the patient were reviewed by me and considered in my medical decision making (see chart for details).     This patient is a 40 year old female presenting with peritonsillar abscess. Today this pt is afebrile nontachycardic nontachypneic, oxygenating well on room air, no wheezes rhonchi or rales. Difficulty swallowing and appears uncomfortable but no difficulty handling secretions, no shortness of breath, no trismus, normal phonation.  Given extent of abscess, I am sending this patient to [facility] ED for further evaluation and management. I believe at this time she requires CT, and likely I&D and IV antibiotic management. Managing this abscess outpatient would be inadvisable.  Patient is in agreement with this treatment plan. She is hemodynamically stable to transport herself to the ED at this time.   This chart was dictated using voice recognition software, Dragon. Despite the best efforts of this provider to proofread and correct errors, errors may still occur which can change documentation meaning.    Final Clinical Impressions(s) / UC Diagnoses   Final diagnoses:  History of tonsillectomy  Peritonsillar abscess     Discharge Instructions     -Head straight to [facility] ED for further evaluation of peritonsillar abscess.  -If you experience dizziness, shortness of breath, chest pain, loss of consciousness, etc while on the way to the ED- stop and call 911.    ED Prescriptions    None     PDMP not  reviewed this encounter.   Rhys Martini, PA-C 05/25/20 1643    Rhys Martini, PA-C 05/25/20 [phone]

## 2020-05-25 NOTE — ED Triage Notes (Signed)
Pt reports sore throat X3 days. Went to UC for eval and sent here for peritonsillar abscess. Pt denies fevers.

## 2020-05-26 ENCOUNTER — Emergency Department (HOSPITAL_COMMUNITY): Payer: Commercial Managed Care - PPO

## 2020-05-26 DIAGNOSIS — R590 Localized enlarged lymph nodes: Secondary | ICD-10-CM | POA: Diagnosis not present

## 2020-05-26 DIAGNOSIS — J351 Hypertrophy of tonsils: Secondary | ICD-10-CM | POA: Diagnosis not present

## 2020-05-26 DIAGNOSIS — J039 Acute tonsillitis, unspecified: Secondary | ICD-10-CM | POA: Diagnosis not present

## 2020-05-26 LAB — GROUP A STREP BY PCR: Group A Strep by PCR: NOT DETECTED

## 2020-05-26 LAB — CBC WITH DIFFERENTIAL/PLATELET
Abs Immature Granulocytes: 0.02 10*3/uL (ref 0.00–0.07)
Basophils Absolute: 0 10*3/uL (ref 0.0–0.1)
Basophils Relative: 0 %
Eosinophils Absolute: 0.1 10*3/uL (ref 0.0–0.5)
Eosinophils Relative: 2 %
HCT: 41.3 % (ref 36.0–46.0)
Hemoglobin: 13.7 g/dL (ref 12.0–15.0)
Immature Granulocytes: 0 %
Lymphocytes Relative: 28 %
Lymphs Abs: 1.8 10*3/uL (ref 0.7–4.0)
MCH: 28.2 pg (ref 26.0–34.0)
MCHC: 33.2 g/dL (ref 30.0–36.0)
MCV: 85 fL (ref 80.0–100.0)
Monocytes Absolute: 0.4 10*3/uL (ref 0.1–1.0)
Monocytes Relative: 6 %
Neutro Abs: 4.1 10*3/uL (ref 1.7–7.7)
Neutrophils Relative %: 64 %
Platelets: 298 10*3/uL (ref 150–400)
RBC: 4.86 MIL/uL (ref 3.87–5.11)
RDW: 14.4 % (ref 11.5–15.5)
WBC: 6.4 10*3/uL (ref 4.0–10.5)
nRBC: 0 % (ref 0.0–0.2)

## 2020-05-26 LAB — I-STAT BETA HCG BLOOD, ED (MC, WL, AP ONLY): I-stat hCG, quantitative: <5 m[IU]/mL (ref <5)

## 2020-05-26 LAB — BASIC METABOLIC PANEL
Anion gap: 8 (ref 5–15)
BUN: <5 mg/dL — ABNORMAL LOW (ref 6–20)
CO2: 23 mmol/L (ref 22–32)
Calcium: 9 mg/dL (ref 8.9–10.3)
Chloride: 103 mmol/L (ref 98–111)
Creatinine, Ser: 0.7 mg/dL (ref 0.44–1.00)
GFR, Estimated: >60 mL/min (ref >60)
Glucose, Bld: 92 mg/dL (ref 70–99)
Potassium: 3.1 mmol/L — ABNORMAL LOW (ref 3.5–5.1)
Sodium: 134 mmol/L — ABNORMAL LOW (ref 135–145)

## 2020-05-26 MED ORDER — DEXAMETHASONE SODIUM PHOSPHATE 10 MG/ML IJ SOLN
10.0000 mg | Freq: Once | INTRAMUSCULAR | Status: AC
  Administered 2020-05-26: 10 mg via INTRAVENOUS
  Filled 2020-05-26: qty 1

## 2020-05-26 MED ORDER — CLINDAMYCIN HCL 150 MG PO CAPS: 450.0000 mg | ORAL_CAPSULE | Freq: Three times a day (TID) | ORAL | 0 refills | Status: AC

## 2020-05-26 MED ORDER — KETOROLAC TROMETHAMINE 30 MG/ML IJ SOLN
30.0000 mg | Freq: Once | INTRAMUSCULAR | Status: AC
  Administered 2020-05-26: 30 mg via INTRAVENOUS
  Filled 2020-05-26: qty 1

## 2020-05-26 MED ORDER — IOHEXOL 300 MG/ML  SOLN
75.0000 mL | Freq: Once | INTRAMUSCULAR | Status: AC | PRN
  Administered 2020-05-26: 75 mL via INTRAVENOUS

## 2020-05-26 MED ORDER — CLINDAMYCIN HCL 150 MG PO CAPS
450.0000 mg | ORAL_CAPSULE | Freq: Once | ORAL | Status: AC
  Administered 2020-05-26: 450 mg via ORAL
  Filled 2020-05-26: qty 3

## 2020-05-26 MED ORDER — LIDOCAINE VISCOUS HCL 2 % MT SOLN: 15.0000 mL | OROMUCOSAL | 0 refills | Status: DC | PRN

## 2020-05-26 NOTE — Discharge Instructions (Addendum)
Thank you for allowing me to care for you today in the Emergency Department.   You were seen today for a sore throat.  Your imaging showed enlargement of your left palatine tonsil, consistent with tonsillitis.  You are very treated by antibiotics with this.  Your first dose was given tonight in the emergency department.  Take 3 tablets of clindamycin every 8 hours for the next 10 days.  You need to call Dr. Thurmon Fair office tomorrow to schedule an appointment with ear nose and throat clinic.  He would like to see you in the clinic at the end of this week to reevaluate you and to further discuss the cystic structure that was seen on your imaging that we discussed at bedside today.  Take 650 mg of Tylenol or 600 mg of ibuprofen with food every 6 hours for pain.  You can alternate between these 2 medications every 3 hours if your pain returns.  For instance, you can take Tylenol at noon, followed by a dose of ibuprofen at 3, followed by second dose of Tylenol and 6.  You can swallow 15 mL of viscous lidocaine once every 3 hours for pain.  Return to the emergency department if you become unable to breathe, if you have closing of your throat, drooling, if you become unable to swallow your own saliva, or have other new, concerning symptoms.

## 2020-05-26 NOTE — ED Notes (Signed)
PT ready for CT

## 2020-05-26 NOTE — ED Provider Notes (Signed)
[facility] EMERGENCY DEPARTMENT Provider Note   CSN: [id number] Arrival date & time: 05/25/20  1514     History Chief Complaint  Patient presents with  . Abscess    [name] is a 40 y.o. female with a history of anemia who presents the emergency department from urgent care with a chief complaint of a sore throat.  The patient presents with a 4-day history of a left-sided sore throat associated with dysphagia, chills, and tactile fever.  Symptoms have been constant, but significantly worsened today.  She has been able to drink fluids, but it is painful.  She denies cough, neck swelling, shortness of breath, chest pain, headache, nasal congestion, rhinorrhea, abdominal pain, nausea, vomiting, or diarrhea.  She has a history of a prior tonsillectomy due to prior peritonsillar abscess.  She is a current, everyday smoker.  She also endorses marijuana use.  No other illicit or recreational substance use.  She reports social alcohol use.  No family history of head or neck cancer.  The history is provided by the patient and medical records. No language interpreter was used.       Past Medical History:  Diagnosis Date  . Anemia   . Gestational diabetes     Patient Active Problem List   Diagnosis Date Noted  . IUD (intrauterine device) in place 05/21/2018  . Low vitamin D level 01/04/2017  . History of preterm delivery 12/30/2016    Past Surgical History:  Procedure Laterality Date  . TONSILLECTOMY  2017     OB History    Gravida  15   Para  5   Term  2   Preterm  3   AB  10   Living  4     SAB  1   IAB  9   Ectopic      Multiple  0   Live Births  4           Family History  Problem Relation Age of Onset  . Hyperlipidemia Mother     Social History   Tobacco Use  . Smoking status: Former Smoker    Types: Cigarettes    Quit date: 2018    Years since quitting: 4.2  . Smokeless tobacco: Never Used  Vaping Use  .  Vaping Use: Never used  Substance Use Topics  . Alcohol use: No    Comment: occ  . Drug use: No    Home Medications Prior to Admission medications   Medication Sig Start Date End Date Taking? Authorizing Provider  clindamycin (CLEOCIN) 150 MG capsule Take 3 capsules (450 mg total) by mouth 3 (three) times daily for 10 days. 05/26/20 06/05/20 Yes McDonald, Mia A, PA-C  lidocaine (XYLOCAINE) 2 % solution Use as directed 15 mLs in the mouth or throat every 3 (three) hours as needed for mouth pain. 05/26/20  Yes McDonald, Mia A, PA-C  ibuprofen (ADVIL,MOTRIN) 800 MG tablet Take 1 tablet (800 mg total) by mouth every 8 (eight) hours as needed. 03/22/18   Tamera Stands, DO  prenatal vitamin w/FE, FA (PRENATAL 1 + 1) 27-1 MG TABS tablet Take 1 tablet by mouth daily at 12 noon. 03/10/17   Brock Bad, MD  senna-docusate (SENOKOT-S) 8.6-50 MG tablet Take 2 tablets by mouth at bedtime as needed for mild constipation. 03/22/18   Tamera Stands, DO  Vitamin D, Ergocalciferol, (DRISDOL) 50000 units CAPS capsule Take 1 capsule (50,000 Units total) by mouth every 7 (  seven) days. 09/14/17   Roe Coombs, CNM    Allergies    Patient has no known allergies.  Review of Systems   Review of Systems  Constitutional: Positive for chills and fever. Negative for activity change.  HENT: Positive for sore throat and trouble swallowing. Negative for congestion, ear discharge, ear pain, facial swelling, hearing loss, mouth sores, postnasal drip, sinus pressure, sinus pain, tinnitus and voice change.   Eyes: Negative for visual disturbance.  Respiratory: Negative for cough, choking, shortness of breath and wheezing.   Cardiovascular: Negative for chest pain and palpitations.  Gastrointestinal: Negative for abdominal pain, constipation, diarrhea, nausea and vomiting.  Genitourinary: Negative for dysuria and urgency.  Musculoskeletal: Negative for back pain, joint swelling, myalgias, neck pain and neck  stiffness.  Skin: Negative for rash and wound.  Allergic/Immunologic: Negative for immunocompromised state.  Neurological: Negative for dizziness, seizures, syncope, weakness, numbness and headaches.  Psychiatric/Behavioral: Negative for confusion.    Physical Exam Updated Vital Signs BP 113/74 (BP Location: Left Arm)   Pulse 63   Temp 98.5 F (36.9 C) (Oral)   Resp 16   LMP 05/03/2020 (Approximate)   SpO2 99%   Physical Exam Vitals and nursing note reviewed.  Constitutional:      General: She is not in acute distress.    Appearance: She is not ill-appearing, toxic-appearing or diaphoretic.  HENT:     Head: Normocephalic.     Jaw: There is normal jaw occlusion.     Comments: Cobblestoning noted to the posterior oropharynx as well as around the left tonsillar pillars.  Uvula is midline.  Posterior oropharynx is patent.    Nose: Nose normal. No congestion or rhinorrhea.     Mouth/Throat:     Mouth: Mucous membranes are moist.     Tongue: No lesions.     Pharynx: Posterior oropharyngeal erythema present. No oropharyngeal exudate or uvula swelling.     Tonsils: No tonsillar exudate.     Comments: No sublingual edema.  No drooling. Eyes:     Conjunctiva/sclera: Conjunctivae normal.  Cardiovascular:     Rate and Rhythm: Normal rate and regular rhythm.     Pulses: Normal pulses.     Heart sounds: Normal heart sounds. No murmur heard. No friction rub. No gallop.   Pulmonary:     Effort: Pulmonary effort is normal. No respiratory distress.     Breath sounds: No stridor. No wheezing, rhonchi or rales.  Chest:     Chest wall: No tenderness.  Abdominal:     General: There is no distension.     Palpations: Abdomen is soft. There is no mass.     Tenderness: There is no abdominal tenderness. There is no right CVA tenderness, left CVA tenderness, guarding or rebound.     Hernia: No hernia is present.  Musculoskeletal:        General: No tenderness.     Cervical back: Neck supple.      Right lower leg: No edema.     Left lower leg: No edema.  Skin:    General: Skin is warm.     Findings: No rash.  Neurological:     Mental Status: She is alert.  Psychiatric:        Behavior: Behavior normal.     ED Results / Procedures / Treatments   Labs (all labs ordered are listed, but only abnormal results are displayed) Labs Reviewed  BASIC METABOLIC PANEL - Abnormal; Notable for the following components:  Result Value   Sodium 134 (*)    Potassium 3.1 (*)    BUN <5 (*)    All other components within normal limits  GROUP A STREP BY PCR  CBC WITH DIFFERENTIAL/PLATELET  I-STAT BETA HCG BLOOD, ED ([facility], [facility], [facility] ONLY)    EKG None  Radiology CT Soft Tissue Neck W Contrast  Result Date: 05/26/2020 CLINICAL DATA:  Throat pain EXAM: CT NECK WITH CONTRAST TECHNIQUE: Multidetector CT imaging of the neck was performed using the standard protocol following the bolus administration of intravenous contrast. CONTRAST:  19mL OMNIPAQUE IOHEXOL 300 MG/ML  SOLN COMPARISON:  01/14/2012 FINDINGS: PHARYNX AND LARYNX: Enlarged left palatine tonsil. No peritonsillar fluid collection. No retropharyngeal abnormality. SALIVARY GLANDS: Normal parotid, submandibular and sublingual glands. THYROID: Normal. LYMPH NODES: Predominantly cystic structure interspersed between the right sternocleidomastoid and internal jugular vein measures 4.0 x 4.5 x 2.2 cm. Previously this measured approximately 1.4 x 0.8 cm. Multiple mildly enlarged posterior left cervical lymph nodes. VASCULAR: Major cervical vessels are patent. LIMITED INTRACRANIAL: Normal. VISUALIZED ORBITS: Normal. MASTOIDS AND VISUALIZED PARANASAL SINUSES: No fluid levels or advanced mucosal thickening. No mastoid effusion. SKELETON: No bony spinal canal stenosis. No lytic or blastic lesions. UPPER CHEST: Clear. OTHER: None. IMPRESSION: 1. Enlarged left palatine tonsil, consistent with acute tonsillopharyngitis. No peritonsillar fluid collection. 2.  Predominantly cystic structure interspersed between the right sternocleidomastoid and internal jugular vein has increased in size, now measuring 4.0 x 4.5 x 2.2 cm. 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 3. Multiple mildly enlarged posterior left cervical lymph nodes, likely reactive. Electronically Signed   By: Deatra Robinson M.D.   On: 05/26/2020 02:20    Procedures Procedures   Medications Ordered in ED Medications  ondansetron (ZOFRAN) injection 4 mg (4 mg Intravenous Given 05/26/20 0007)  morphine 4 MG/ML injection 4 mg (4 mg Intravenous Given 05/26/20 0007)  iohexol (OMNIPAQUE) 300 MG/ML solution 75 mL (75 mLs Intravenous Contrast Given 05/26/20 0208)  dexamethasone (DECADRON) injection 10 mg (10 mg Intravenous Given 05/26/20 0350)  ketorolac (TORADOL) 30 MG/ML injection 30 mg (30 mg Intravenous Given 05/26/20 0349)  clindamycin (CLEOCIN) capsule 450 mg (450 mg Oral Given 05/26/20 0352)    ED Course  I have reviewed the triage vital signs and the nursing notes.  Pertinent labs & imaging results that were available during my care of the patient were reviewed by me and considered in my medical decision making (see chart for details).    MDM Rules/Calculators/A&P                          40 year old female with a history of anemia who presents to the emergency department from urgent care with a 4-day history of left-sided sore throat, dysphagia, chills, and tactile fever.  Vital signs are stable.  On exam, there is edema noted to the left tonsillar pillars with cobblestoning and erythema.  No exudates.  Uvula is midline and she is tolerating secretions without difficulty.  With history of prior tonsillectomy, could be recurrent peritonsillar abscess, but will order CT scan for further evaluation as exam does not appear overtly consistent with a peritonsillar abscess.  Labs and imaging of been reviewed and independently interpreted by  me.  No leukocytosis.  No metabolic derangements.   CT with acute tonsillopharyngitis involving the left palatine tonsil.  There is no peritonsillar fluid collection.  There is also a predominantly cystic structure interspersed between the  right sternocleidomastoid and internal jugular vein.  This was noted on a previous CT in 2013 when it measured 1.4 x 0.8 cm.  Now, it has increased in size to 4.0 x 4.5 x 2.2 cm.  Patient is endorsing some tenderness to palpation in this area. Reviewed CT findings with Dr. Wilkie AyeHorton, attending physician. ENT consult recommended.  Consult to ENT and spoke with Dr. Annalee GentaShoemaker.  He recommends having the patient follow-up in the clinic later this week.  He recommends treating tonsillitis with antibiotics.  Consult appreciated.  Patient was treated with Decadron and Toradol.  She was started on clindamycin and first dose was given in the ER.  At this time, the patient is hemodynamically stable no acute distress.  Safe for discharge to home with outpatient follow-up to ENT.  ER return precautions given.   Final Clinical Impression(s) / ED Diagnoses Final diagnoses:  Tonsillitis  Neck mass    Rx / DC Orders ED Discharge Orders         Ordered    lidocaine (XYLOCAINE) 2 % solution  Every  3 hours PRN        05/26/20 0333    clindamycin (CLEOCIN) 150 MG capsule  3 times daily        05/26/20 0333           Frederik PearMcDonald, Mia A, PA-C 05/26/20 0450    Shon BatonHorton, Courtney F, MD 05/26/20 [phone]

## 2020-06-02 DIAGNOSIS — J039 Acute tonsillitis, unspecified: Secondary | ICD-10-CM | POA: Diagnosis not present

## 2020-06-02 DIAGNOSIS — Q180 Sinus, fistula and cyst of branchial cleft: Secondary | ICD-10-CM | POA: Diagnosis not present

## 2020-06-03 ENCOUNTER — Other Ambulatory Visit: Payer: Self-pay | Admitting: Otolaryngology

## 2020-06-03 DIAGNOSIS — Q180 Sinus, fistula and cyst of branchial cleft: Secondary | ICD-10-CM

## 2020-06-29 ENCOUNTER — Other Ambulatory Visit: Payer: Self-pay

## 2020-06-29 ENCOUNTER — Encounter (HOSPITAL_COMMUNITY): Payer: Self-pay

## 2020-06-29 ENCOUNTER — Ambulatory Visit (HOSPITAL_COMMUNITY)
Admission: EM | Admit: 2020-06-29 | Discharge: 2020-06-29 | Disposition: A | Discharge status: Home / Self Care | Payer: Commercial Managed Care - PPO

## 2020-06-29 DIAGNOSIS — J069 Acute upper respiratory infection, unspecified: Secondary | ICD-10-CM | POA: Diagnosis not present

## 2020-06-29 NOTE — ED Provider Notes (Signed)
[facility]    CSN: [id number] Arrival date & time: 06/29/20  1626      History   Chief Complaint Chief Complaint  Patient presents with  . Nasal Congestion  . Chills  . Diarrhea    HPI Kathy COCUZZA is a 40 y.o. female.   Patient presents with congestion, rhinorrhea, mild sore throat, diarrhea, productive cough, intermittent generalized headache, chills and fatigue for 2 days. Diarrhea has resolved. Denies fever, ear pain, chest pain, shortness of breath. Denies sick contacts. Taking otc medication with no relief.  Past Medical History:  Diagnosis Date  . Anemia   . Gestational diabetes     Patient Active Problem List   Diagnosis Date Noted  . IUD (intrauterine device) in place 05/21/2018  . Low vitamin D level 01/04/2017  . History of preterm delivery 12/30/2016    Past Surgical History:  Procedure Laterality Date  . TONSILLECTOMY  2017    OB History    Gravida  15   Para  5   Term  2   Preterm  3   AB  10   Living  4     SAB  1   IAB  9   Ectopic      Multiple  0   Live Births  4            Home Medications    Prior to Admission medications   Medication Sig Start Date End Date Taking? Authorizing Provider  ibuprofen (ADVIL,MOTRIN) 800 MG tablet Take 1 tablet (800 mg total) by mouth every 8 (eight) hours as needed. 03/22/18   Tamera Stands, DO  lidocaine (XYLOCAINE) 2 % solution Use as directed 15 mLs in the mouth or throat every 3 (three) hours as needed for mouth pain. 05/26/20   McDonald, Mia A, PA-C  prenatal vitamin w/FE, FA (PRENATAL 1 + 1) 27-1 MG TABS tablet Take 1 tablet by mouth daily at 12 noon. 03/10/17   Brock Bad, MD  senna-docusate (SENOKOT-S) 8.6-50 MG tablet Take 2 tablets by mouth at bedtime as needed for mild constipation. 03/22/18   Tamera Stands, DO  Vitamin D, Ergocalciferol, (DRISDOL) 50000 units CAPS capsule Take 1 capsule (50,000 Units total) by mouth every 7 (seven) days. 09/14/17    Roe Coombs, CNM    Family History Family History  Problem Relation Age of Onset  . Hyperlipidemia Mother     Social History Social History   Tobacco Use  . Smoking status: Former Smoker    Types: Cigarettes    Quit date: 2018    Years since quitting: 4.3  . Smokeless tobacco: Never Used  Vaping Use  . Vaping Use: Never used  Substance Use Topics  . Alcohol use: No    Comment: occ  . Drug use: No     Allergies   Patient has no known allergies.   Review of Systems Review of Systems  Defer to HPI    Physical Exam Triage Vital Signs ED Triage Vitals  Enc Vitals Group     BP 06/29/20 1717 134/87     Pulse Rate 06/29/20 1717 69     Resp 06/29/20 1717 18     Temp 06/29/20 1717 98.6 F (37 C)     Temp src --      SpO2 06/29/20 1717 100 %     Weight --      Height --      Head Circumference --  Peak Flow --      Pain Score 06/29/20 1716 0     Pain Loc --      Pain Edu? --      Excl. in GC? --    No data found.  Updated Vital Signs BP 134/87   Pulse 69   Temp 98.6 F (37 C)   Resp 18   LMP 06/28/2020   SpO2 100%   Visual Acuity Right Eye Distance:   Left Eye Distance:   Bilateral Distance:    Right Eye Near:   Left Eye Near:    Bilateral Near:     Physical Exam Constitutional:      Appearance: Normal appearance. She is normal weight.  HENT:     Head: Normocephalic.     Right Ear: Tympanic membrane, ear canal and external ear normal.     Left Ear: Tympanic membrane, ear canal and external ear normal.     Nose: Congestion and rhinorrhea present.     Mouth/Throat:     Mouth: Mucous membranes are moist.     Pharynx: Posterior oropharyngeal erythema present. No oropharyngeal exudate.  Eyes:     Extraocular Movements: Extraocular movements intact.     Conjunctiva/sclera: Conjunctivae normal.     Pupils: Pupils are equal, round, and reactive to light.  Cardiovascular:     Rate and Rhythm: Normal rate and regular rhythm.      Pulses: Normal pulses.     Heart sounds: Normal heart sounds.  Pulmonary:     Effort: Pulmonary effort is normal.     Breath sounds: Normal breath sounds.  Musculoskeletal:     Cervical back: Normal range of motion.  Lymphadenopathy:     Cervical: Cervical adenopathy present.  Skin:    General: Skin is warm and dry.  Neurological:     Mental Status: She is alert and oriented to person, place, and time. Mental status is at baseline.  Psychiatric:        Mood and Affect: Mood normal.        Behavior: Behavior normal.        Thought Content: Thought content normal.        Judgment: Judgment normal.      UC Treatments / Results  Labs (all labs ordered are listed, but only abnormal results are displayed) Labs Reviewed - No data to display  EKG   Radiology No results found.  Procedures Procedures (including critical care time)  Medications Ordered in UC Medications - No data to display  Initial Impression / Assessment and Plan / UC Course  I have reviewed the triage vital signs and the nursing notes.  Pertinent labs & imaging results that were available during my care of the patient were reviewed by me and considered in my medical decision making (see chart for details).  Viral URI with cough  1.. declined covid testing 2. Continue use of otc medications for symptom management  Final Clinical Impressions(s) / UC Diagnoses   Final diagnoses:  Viral URI with cough     Discharge Instructions     Can use any over the counter medications for attempted relief of symptoms      ED Prescriptions    None     PDMP not reviewed this encounter.   Valinda Hoar, NP 06/29/20 1827

## 2020-06-29 NOTE — Discharge Instructions (Addendum)
Can use any over the counter medications for attempted relief of symptoms

## 2020-06-29 NOTE — ED Triage Notes (Signed)
Pt presents with nasal congestion and feeling unwell that bean 2 days ago, pt states she had some diarrhea this morning

## 2020-07-13 ENCOUNTER — Ambulatory Visit
Admission: RE | Admit: 2020-07-13 | Discharge: 2020-07-13 | Disposition: A | Discharge status: Home / Self Care | Payer: Commercial Managed Care - PPO | Source: Ambulatory Visit | Attending: Otolaryngology | Admitting: Otolaryngology

## 2020-07-13 DIAGNOSIS — Q180 Sinus, fistula and cyst of branchial cleft: Secondary | ICD-10-CM

## 2020-07-14 DIAGNOSIS — Q180 Sinus, fistula and cyst of branchial cleft: Secondary | ICD-10-CM | POA: Diagnosis not present

## 2020-08-25 DIAGNOSIS — L02429 Furuncle of limb, unspecified: Secondary | ICD-10-CM | POA: Diagnosis not present

## 2020-08-25 DIAGNOSIS — R252 Cramp and spasm: Secondary | ICD-10-CM | POA: Diagnosis not present

## 2020-09-23 ENCOUNTER — Other Ambulatory Visit: Payer: Self-pay

## 2020-09-23 ENCOUNTER — Encounter (HOSPITAL_COMMUNITY)
Admission: RE | Admit: 2020-09-23 | Discharge: 2020-09-23 | Disposition: A | Discharge status: Home / Self Care | Payer: Commercial Managed Care - PPO | Source: Ambulatory Visit | Attending: Otolaryngology | Admitting: Otolaryngology

## 2020-09-23 ENCOUNTER — Encounter (HOSPITAL_COMMUNITY): Payer: Self-pay

## 2020-09-23 DIAGNOSIS — Z20822 Contact with and (suspected) exposure to covid-19: Secondary | ICD-10-CM | POA: Insufficient documentation

## 2020-09-23 DIAGNOSIS — Z01812 Encounter for preprocedural laboratory examination: Secondary | ICD-10-CM | POA: Insufficient documentation

## 2020-09-23 HISTORY — DX: Essential (primary) hypertension: I10

## 2020-09-23 HISTORY — DX: Headache, unspecified: R51.9

## 2020-09-23 LAB — CBC
HCT: 45.9 % (ref 36.0–46.0)
Hemoglobin: 14.7 g/dL (ref 12.0–15.0)
MCH: 27.9 pg (ref 26.0–34.0)
MCHC: 32 g/dL (ref 30.0–36.0)
MCV: 87.3 fL (ref 80.0–100.0)
Platelets: 353 10*3/uL (ref 150–400)
RBC: 5.26 MIL/uL — ABNORMAL HIGH (ref 3.87–5.11)
RDW: 14.7 % (ref 11.5–15.5)
WBC: 4.8 10*3/uL (ref 4.0–10.5)
nRBC: 0 % (ref 0.0–0.2)

## 2020-09-23 LAB — SARS CORONAVIRUS 2 (TAT 6-24 HRS): SARS Coronavirus 2: NEGATIVE

## 2020-09-23 NOTE — Progress Notes (Signed)
PCP - Leilani Able Cardiologist - denies  PPM/ICD - denies   Chest x-ray - n/a EKG - n/a Stress Test - denies ECHO - denies Cardiac Cath - denies  Sleep Study - denies   No diabetes (history of gestational diabetes that has resolved)  As of today, STOP taking any Aspirin (unless otherwise instructed by your surgeon) Aleve, Naproxen, Ibuprofen, Motrin, Advil, Goody's, BC's, all herbal medications, fish oil, and all vitamins.  ERAS Protcol -yes   COVID TEST- 09/23/20 in PAT   Anesthesia review: no  Patient denies shortness of breath, fever, cough and chest pain at PAT appointment   All instructions explained to the patient, with a verbal understanding of the material. Patient agrees to go over the instructions while at home for a better understanding. Patient also instructed to self quarantine after being tested for COVID-19. The opportunity to ask questions was provided.

## 2020-09-23 NOTE — Progress Notes (Signed)
Surgical Instructions    Your procedure is scheduled on 09/25/20.  Report to [facility] Main Entrance "A" at 06:45 A.M., then check in with the Admitting office.  Call this number if you have problems the morning of surgery:  [phone]   If you have any questions prior to your surgery date call [phone]: Open Monday-Friday 8am-4pm    Remember:  Do not eat after midnight the night before your surgery  You may drink clear liquids until 05:45am the morning of your surgery.   Clear liquids allowed are: Water, Non-Citrus Juices (without pulp), Carbonated Beverages, Clear Tea, Black Coffee Only, and Gatorade    Take these medicines the morning of surgery with A SIP OF WATER: NONE     As of today, STOP taking any Aspirin (unless otherwise instructed by your surgeon) Aleve, Naproxen, Ibuprofen, Motrin, Advil, Goody's, BC's, all herbal medications, fish oil, and all vitamins.          Do not wear jewelry or makeup Do not wear lotions, powders, perfumes, or deodorant. Do not shave 48 hours prior to surgery.   Do not bring valuables to the hospital.  DO Not wear nail polish, gel polish, artificial nails, or any other type of covering on natural nails  including finger and toenails. If patients have artificial nails, gel coating, etc. that need to be removed by a nail salon please have this removed prior to surgery or surgery may need to be canceled/delayed if the surgeon/ anesthesia feels like the patient is unable to be adequately monitored.             [facility] is not responsible for any belongings or valuables.  Do NOT Smoke (Tobacco/Vaping) or drink Alcohol 24 hours prior to your procedure If you use a CPAP at night, you may bring all equipment for your overnight stay.   Contacts, glasses, dentures or bridgework may not be worn into surgery, please bring cases for these belongings   For patients admitted to the hospital, discharge time will be determined by your treatment  team.   Patients discharged the day of surgery will not be allowed to drive home, and someone needs to stay with them for 24 hours.  ONLY 1 SUPPORT PERSON MAY BE PRESENT WHILE YOU ARE IN SURGERY. IF YOU ARE TO BE ADMITTED ONCE YOU ARE IN YOUR ROOM YOU WILL BE ALLOWED TWO (2) VISITORS.  Minor children may have two parents present. Special consideration for safety and communication needs will be reviewed on a case by case basis.  Special instructions:    Oral Hygiene is also important to reduce your risk of infection.  Remember - BRUSH YOUR TEETH THE MORNING OF SURGERY WITH YOUR REGULAR TOOTHPASTE   - Preparing For Surgery  Before surgery, you can play an important role. Because skin is not sterile, your skin needs to be as free of germs as possible. You can reduce the number of germs on your skin by washing with CHG (chlorahexidine gluconate) Soap before surgery.  CHG is an antiseptic cleaner which kills germs and bonds with the skin to continue killing germs even after washing.     Please do not use if you have an allergy to CHG or antibacterial soaps. If your skin becomes reddened/irritated stop using the CHG.  Do not shave (including legs and underarms) for at least 48 hours prior to first CHG shower. It is OK to shave your face.  Please follow these instructions carefully.  Shower the NIGHT BEFORE SURGERY and the MORNING OF SURGERY with CHG Soap.   If you chose to wash your hair, wash your hair first as usual with your normal shampoo. After you shampoo, rinse your hair and body thoroughly to remove the shampoo.  Then [employer] and genitals (private parts) with your normal soap and rinse thoroughly to remove soap.  After that Use CHG Soap as you would any other liquid soap. You can apply CHG directly to the skin and wash gently with a scrungie or a clean washcloth.   Apply the CHG Soap to your body ONLY FROM THE NECK DOWN.  Do not use on open wounds or open sores. Avoid  contact with your eyes, ears, mouth and genitals (private parts). Wash Face and genitals (private parts)  with your normal soap.   Wash thoroughly, paying special attention to the area where your surgery will be performed.  Thoroughly rinse your body with warm water from the neck down.  DO NOT shower/wash with your normal soap after using and rinsing off the CHG Soap.  Pat yourself dry with a CLEAN TOWEL.  Wear CLEAN PAJAMAS to bed the night before surgery  Place CLEAN SHEETS on your bed the night before your surgery  DO NOT SLEEP WITH PETS.   Day of Surgery: Take a shower with CHG soap. Wear Clean/Comfortable clothing the morning of surgery Do not apply any deodorants/lotions.   Remember to brush your teeth WITH YOUR REGULAR TOOTHPASTE.   Please read over the following fact sheets that you were given.

## 2020-09-25 ENCOUNTER — Encounter (HOSPITAL_COMMUNITY): Payer: Self-pay | Admitting: Otolaryngology

## 2020-09-25 ENCOUNTER — Ambulatory Visit (HOSPITAL_COMMUNITY): Payer: Medicaid Other | Admitting: Certified Registered"

## 2020-09-25 ENCOUNTER — Ambulatory Visit (HOSPITAL_COMMUNITY)
Admission: RE | Admit: 2020-09-25 | Discharge: 2020-09-26 | Disposition: A | Discharge status: Home / Self Care | Payer: Medicaid Other | Attending: Otolaryngology | Admitting: Otolaryngology

## 2020-09-25 ENCOUNTER — Encounter (HOSPITAL_COMMUNITY)
Admission: RE | Disposition: A | Discharge status: Home / Self Care | Payer: Self-pay | Source: Home / Self Care | Attending: Otolaryngology

## 2020-09-25 DIAGNOSIS — Q180 Sinus, fistula and cyst of branchial cleft: Secondary | ICD-10-CM | POA: Insufficient documentation

## 2020-09-25 DIAGNOSIS — R221 Localized swelling, mass and lump, neck: Secondary | ICD-10-CM

## 2020-09-25 DIAGNOSIS — E559 Vitamin D deficiency, unspecified: Secondary | ICD-10-CM | POA: Diagnosis not present

## 2020-09-25 DIAGNOSIS — D649 Anemia, unspecified: Secondary | ICD-10-CM | POA: Diagnosis not present

## 2020-09-25 DIAGNOSIS — F17210 Nicotine dependence, cigarettes, uncomplicated: Secondary | ICD-10-CM | POA: Insufficient documentation

## 2020-09-25 HISTORY — PX: EAR CYST EXCISION: SHX22

## 2020-09-25 LAB — POCT PREGNANCY, URINE: Preg Test, Ur: NEGATIVE

## 2020-09-25 SURGERY — EXCISION, BRANCHIAL CLEFT CYST
Anesthesia: General | Site: Neck | Laterality: Right

## 2020-09-25 MED ORDER — DEXTROSE IN LACTATED RINGERS 5 % IV SOLN: INTRAVENOUS | Status: DC

## 2020-09-25 MED ORDER — 0.9 % SODIUM CHLORIDE (POUR BTL) OPTIME
TOPICAL | Status: DC | PRN
  Administered 2020-09-25: 1000 mL

## 2020-09-25 MED ORDER — PROPOFOL 10 MG/ML IV BOLUS
INTRAVENOUS | Status: AC
  Filled 2020-09-25: qty 20

## 2020-09-25 MED ORDER — FENTANYL CITRATE (PF) 250 MCG/5ML IJ SOLN
INTRAMUSCULAR | Status: AC
  Filled 2020-09-25: qty 5

## 2020-09-25 MED ORDER — PROPOFOL 10 MG/ML IV BOLUS
INTRAVENOUS | Status: DC | PRN
  Administered 2020-09-25: 30 mg via INTRAVENOUS
  Administered 2020-09-25: 200 mg via INTRAVENOUS

## 2020-09-25 MED ORDER — KETOROLAC TROMETHAMINE 30 MG/ML IJ SOLN
INTRAMUSCULAR | Status: AC
  Filled 2020-09-25: qty 1

## 2020-09-25 MED ORDER — IBUPROFEN 600 MG PO TABS: 600.0000 mg | ORAL_TABLET | Freq: Four times a day (QID) | ORAL | Status: DC | PRN

## 2020-09-25 MED ORDER — FENTANYL CITRATE (PF) 100 MCG/2ML IJ SOLN
25.0000 ug | INTRAMUSCULAR | Status: DC | PRN
  Administered 2020-09-25: 50 ug via INTRAVENOUS

## 2020-09-25 MED ORDER — ROCURONIUM BROMIDE 10 MG/ML (PF) SYRINGE
PREFILLED_SYRINGE | INTRAVENOUS | Status: AC
  Filled 2020-09-25: qty 10

## 2020-09-25 MED ORDER — OXYCODONE HCL 5 MG PO TABS: 5.0000 mg | ORAL_TABLET | Freq: Once | ORAL | Status: DC | PRN

## 2020-09-25 MED ORDER — LIDOCAINE-EPINEPHRINE 1 %-1:100000 IJ SOLN
INTRAMUSCULAR | Status: DC | PRN
  Administered 2020-09-25: 3 mL

## 2020-09-25 MED ORDER — LIDOCAINE 2% (20 MG/ML) 5 ML SYRINGE
INTRAMUSCULAR | Status: AC
  Filled 2020-09-25: qty 5

## 2020-09-25 MED ORDER — FENTANYL CITRATE (PF) 100 MCG/2ML IJ SOLN
INTRAMUSCULAR | Status: AC
  Filled 2020-09-25: qty 2

## 2020-09-25 MED ORDER — ONDANSETRON HCL 4 MG/2ML IJ SOLN
4.0000 mg | INTRAMUSCULAR | Status: DC | PRN
  Administered 2020-09-26: 4 mg via INTRAVENOUS
  Filled 2020-09-25: qty 2

## 2020-09-25 MED ORDER — KETOROLAC TROMETHAMINE 30 MG/ML IJ SOLN
30.0000 mg | Freq: Once | INTRAMUSCULAR | Status: AC
  Administered 2020-09-25: 30 mg via INTRAVENOUS

## 2020-09-25 MED ORDER — MORPHINE SULFATE (PF) 2 MG/ML IV SOLN
2.0000 mg | INTRAVENOUS | Status: DC | PRN
  Administered 2020-09-26 (×2): 2 mg via INTRAVENOUS
  Filled 2020-09-25 (×2): qty 1

## 2020-09-25 MED ORDER — MIDAZOLAM HCL 2 MG/2ML IJ SOLN
INTRAMUSCULAR | Status: DC | PRN
  Administered 2020-09-25: 2 mg via INTRAVENOUS

## 2020-09-25 MED ORDER — ORAL CARE MOUTH RINSE: 15.0000 mL | Freq: Once | OROMUCOSAL | Status: AC

## 2020-09-25 MED ORDER — CEFAZOLIN SODIUM-DEXTROSE 2-4 GM/100ML-% IV SOLN
2000.0000 mg | Freq: Once | INTRAVENOUS | Status: AC
  Administered 2020-09-25: 2000 mg via INTRAVENOUS
  Filled 2020-09-25: qty 100

## 2020-09-25 MED ORDER — PROMETHAZINE HCL 25 MG/ML IJ SOLN: 6.2500 mg | INTRAMUSCULAR | Status: DC | PRN

## 2020-09-25 MED ORDER — LIDOCAINE-EPINEPHRINE 1 %-1:100000 IJ SOLN
INTRAMUSCULAR | Status: AC
  Filled 2020-09-25: qty 1

## 2020-09-25 MED ORDER — ROCURONIUM BROMIDE 10 MG/ML (PF) SYRINGE
PREFILLED_SYRINGE | INTRAVENOUS | Status: DC | PRN
Start: 2020-09-25 — End: 2020-09-25
  Administered 2020-09-25: 40 mg via INTRAVENOUS

## 2020-09-25 MED ORDER — FENTANYL CITRATE (PF) 100 MCG/2ML IJ SOLN
INTRAMUSCULAR | Status: DC | PRN
  Administered 2020-09-25: 100 ug via INTRAVENOUS
  Administered 2020-09-25 (×2): 50 ug via INTRAVENOUS
  Administered 2020-09-25: 100 ug via INTRAVENOUS

## 2020-09-25 MED ORDER — ONDANSETRON HCL 4 MG PO TABS: 4.0000 mg | ORAL_TABLET | ORAL | Status: DC | PRN

## 2020-09-25 MED ORDER — CHLORHEXIDINE GLUCONATE 0.12 % MT SOLN
OROMUCOSAL | Status: AC
  Administered 2020-09-25: 15 mL via OROMUCOSAL
  Filled 2020-09-25: qty 15

## 2020-09-25 MED ORDER — HYDROCODONE-ACETAMINOPHEN 5-325 MG PO TABS
1.0000 | ORAL_TABLET | ORAL | Status: DC | PRN
  Administered 2020-09-25 – 2020-09-26 (×3): 2 via ORAL
  Filled 2020-09-25 (×3): qty 2

## 2020-09-25 MED ORDER — ONDANSETRON HCL 4 MG/2ML IJ SOLN
INTRAMUSCULAR | Status: DC | PRN
  Administered 2020-09-25: 4 mg via INTRAVENOUS

## 2020-09-25 MED ORDER — LIDOCAINE 2% (20 MG/ML) 5 ML SYRINGE
INTRAMUSCULAR | Status: DC | PRN
  Administered 2020-09-25: 60 mg via INTRAVENOUS

## 2020-09-25 MED ORDER — HYDROCODONE-ACETAMINOPHEN 5-325 MG PO TABS: 1.0000 | ORAL_TABLET | Freq: Four times a day (QID) | ORAL | 0 refills | Status: AC | PRN

## 2020-09-25 MED ORDER — ACETAMINOPHEN 10 MG/ML IV SOLN: 1000.0000 mg | Freq: Once | INTRAVENOUS | Status: DC | PRN

## 2020-09-25 MED ORDER — SUGAMMADEX SODIUM 200 MG/2ML IV SOLN
INTRAVENOUS | Status: DC | PRN
  Administered 2020-09-25: 200 mg via INTRAVENOUS

## 2020-09-25 MED ORDER — ACETAMINOPHEN 160 MG/5ML PO SOLN: 650.0000 mg | ORAL | Status: DC | PRN

## 2020-09-25 MED ORDER — ONDANSETRON HCL 4 MG/2ML IJ SOLN
INTRAMUSCULAR | Status: AC
  Filled 2020-09-25: qty 2

## 2020-09-25 MED ORDER — MIDAZOLAM HCL 2 MG/2ML IJ SOLN
INTRAMUSCULAR | Status: AC
  Filled 2020-09-25: qty 2

## 2020-09-25 MED ORDER — OXYCODONE HCL 5 MG/5ML PO SOLN
5.0000 mg | Freq: Once | ORAL | Status: DC | PRN
Start: 2020-09-25 — End: 2020-09-25

## 2020-09-25 MED ORDER — CHLORHEXIDINE GLUCONATE 0.12 % MT SOLN: 15.0000 mL | Freq: Once | OROMUCOSAL | Status: AC

## 2020-09-25 MED ORDER — ACETAMINOPHEN 650 MG RE SUPP: 650.0000 mg | RECTAL | Status: DC | PRN

## 2020-09-25 MED ORDER — DEXAMETHASONE SODIUM PHOSPHATE 10 MG/ML IJ SOLN
10.0000 mg | Freq: Once | INTRAMUSCULAR | Status: AC
  Administered 2020-09-25: 10 mg via INTRAVENOUS
  Filled 2020-09-25 (×2): qty 1

## 2020-09-25 MED ORDER — AMISULPRIDE (ANTIEMETIC) 5 MG/2ML IV SOLN: 10.0000 mg | Freq: Once | INTRAVENOUS | Status: DC | PRN

## 2020-09-25 MED ORDER — LACTATED RINGERS IV SOLN
INTRAVENOUS | Status: DC
Start: 2020-09-25 — End: 2020-09-25

## 2020-09-25 SURGICAL SUPPLY — 41 items
ADH SKN CLS APL DERMABOND .7 (GAUZE/BANDAGES/DRESSINGS) ×1
BAG COUNTER SPONGE SURGICOUNT (BAG) ×2 IMPLANT
BAG SPNG CNTER NS LX DISP (BAG) ×1
BNDG EYE OVAL (GAUZE/BANDAGES/DRESSINGS) ×2 IMPLANT
CANISTER SUCT 3000ML PPV (MISCELLANEOUS) ×2 IMPLANT
CLEANER TIP ELECTROSURG 2X2 (MISCELLANEOUS) IMPLANT
CNTNR URN SCR LID CUP LEK RST (MISCELLANEOUS) ×1 IMPLANT
CONT SPEC 4OZ STRL OR WHT (MISCELLANEOUS) ×2
CORD BIPOLAR FORCEPS 12FT (ELECTRODE) ×1 IMPLANT
COVER SURGICAL LIGHT HANDLE (MISCELLANEOUS) IMPLANT
DERMABOND ADVANCED (GAUZE/BANDAGES/DRESSINGS) ×1
DERMABOND ADVANCED .7 DNX12 (GAUZE/BANDAGES/DRESSINGS) IMPLANT
DRAIN JP 10F RND RADIO (DRAIN) ×1 IMPLANT
DRSG TELFA 3X8 NADH (GAUZE/BANDAGES/DRESSINGS) ×2 IMPLANT
ELECT COATED BLADE 2.86 ST (ELECTRODE) IMPLANT
ELECT REM PT RETURN 9FT ADLT (ELECTROSURGICAL) ×2
ELECTRODE REM PT RTRN 9FT ADLT (ELECTROSURGICAL) ×1 IMPLANT
EVACUATOR SILICONE 100CC (DRAIN) ×1 IMPLANT
FORCEPS BIPOLAR SPETZLER 8 1.0 (NEUROSURGERY SUPPLIES) ×1 IMPLANT
GAUZE 4X4 16PLY ~~LOC~~+RFID DBL (SPONGE) ×2 IMPLANT
GLOVE SURG ENC TEXT LTX SZ7 (GLOVE) ×2 IMPLANT
GOWN STRL REUS W/ TWL LRG LVL3 (GOWN DISPOSABLE) ×2 IMPLANT
GOWN STRL REUS W/TWL LRG LVL3 (GOWN DISPOSABLE) ×4
KIT TURNOVER KIT B (KITS) ×2 IMPLANT
NDL HYPO 25GX1X1/2 BEV (NEEDLE) IMPLANT
NEEDLE HYPO 25GX1X1/2 BEV (NEEDLE) IMPLANT
NS IRRIG 1000ML POUR BTL (IV SOLUTION) ×2 IMPLANT
PAD ARMBOARD 7.5X6 YLW CONV (MISCELLANEOUS) ×4 IMPLANT
PAD DRESSING TELFA 3X8 NADH (GAUZE/BANDAGES/DRESSINGS) ×1 IMPLANT
PENCIL SMOKE EVACUATOR (MISCELLANEOUS) ×2 IMPLANT
SET WALTER ACTIVATION W/DRAPE (SET/KITS/TRAYS/PACK) ×1 IMPLANT
SPECIMEN JAR SMALL (MISCELLANEOUS) ×2 IMPLANT
SPONGE INTESTINAL PEANUT (DISPOSABLE) ×1 IMPLANT
SUT ETHILON 3 0 FSL (SUTURE) ×1 IMPLANT
SUT SILK 3 0 REEL (SUTURE) ×1 IMPLANT
SUT VIC AB 4-0 PS2 18 (SUTURE) ×1 IMPLANT
SUT VIC AB 5-0 PS2 18 (SUTURE) ×1 IMPLANT
TOWEL GREEN STERILE FF (TOWEL DISPOSABLE) ×2 IMPLANT
TRAY ENT MC OR (CUSTOM PROCEDURE TRAY) ×2 IMPLANT
TUBE CONNECTING 12X1/4 (SUCTIONS) ×2 IMPLANT
WATER STERILE IRR 1000ML POUR (IV SOLUTION) ×2 IMPLANT

## 2020-09-25 NOTE — Op Note (Signed)
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  Assist: Beckey Rutter, PA  Beckey Rutter, [place] assistance was required throughout the surgical procedure including surgical planning, retraction, management of bleeding and surgical decision-making throughout the operation.   Complications: None  EBL: Minimal   Brief History: The patient is a 40 y.o. female with a history of infected right lateral neck mass.  Patient originally evaluated in the [facility] emergency department with a 4 cm infected mass in the right deep neck.  She was treated with appropriate intravenous and oral antibiotic therapy and a follow-up CT scan showed a cystic-appearing mass in the mid aspect of the right deep neck consistent with a branchial cleft cyst.  No significant prior history of infection or swelling.. Given the patient's history and findings I recommended excision of right deep neck mass under general anesthesia.  Risks and benefits were discussed in detail with the patient and their family. They understand and agree with our plan for surgery which is scheduled at [facility] on an elective basis.  Surgical Procedure: The patient is brought to the operating room on 09/25/2020 and placed in supine position on the operating table. General LMA anesthesia was established without difficulty. When the patient was adequately anesthetized, surgical timeout was performed and correct identification of the patient and the surgical procedure. The patient was positioned and prepped and draped in sterile fashion.  3 cc of 1% lidocaine/1:100,000 solution epinephrine was injected in a subcutaneous fashion overlying the  palpable neck mass.  Using a #15 scalpel a 4 cm horizontally oriented skin incision was created in a pre-existing skin crease, this carried through the skin underlying deep subcutaneous tissue using Bovie electrocautery.  Soft tissue was elevated superiorly and inferiorly and dissection was carried deep to the platysma/superficial muscular layer.  This exposed the deep aspect of the neck.  The anterior border of the sternocleidomastoid muscle was then dissected superiorly and inferiorly and the palpable mass was palpated in the deep aspect of the neck.  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  The spinal accessory nerve was identified and traced from distal to proximal, fibrous attachments were then carefully dissected from the entire cyst and the nerve was preserved in's entirety.  Along the superior aspect of the dissection the posterior belly the digastric muscle was identified and this was used as a superior landmark.  Dense adhesions were carefully dissected using blunt and sharp dissection and bipolar cautery.  Anteriorly the jugular vein was identified and dissected free of surrounding scar attachments.  The mass was then carefully dissected from the deep aspect of the neck and attachments were divided and suture-ligated.  The mass was then removed in's entirety and sent to pathology for gross microscopic evaluation.  The incision was thoroughly irrigated, hemostasis was obtained using bipolar electrocautery.  10 [place] round drain was then placed at the depth of the incision and sutured to the skin edge with 4-0 Ethilon suture. The incision was then closed in multiple layers beginning with reapproximation of the muscular layer using 4-0 Vicryl suture in interrupted fashion.  Subcutaneous layer was closed with interrupted 4-0 Vicryl suture.   Skin edge was approximated  using Dermabond surgical glue.  An orogastric  tube was passed and stomach contents were aspirated. Patient was awakened from anesthetic and transferred from the operating room to the recovery room in stable condition. There were no complications and blood loss was minimal.   Barbee Cough, M.D. [facility] 09/25/2020 right cystic

## 2020-09-25 NOTE — Plan of Care (Signed)
  Problem: Education: Goal: Required Educational Video(s) Outcome: Progressing   Problem: Clinical Measurements: Goal: Ability to maintain clinical measurements within normal limits will improve Outcome: Progressing Goal: Postoperative complications will be avoided or minimized Outcome: Progressing   Problem: Skin Integrity: Goal: Demonstration of wound healing without infection will improve Outcome: Progressing   

## 2020-09-25 NOTE — Transfer of Care (Signed)
Immediate Anesthesia Transfer of Care Note  Patient: [name]  Procedure(s) Performed: Excision of Right Branchial Cyst (Right: Neck)  Patient Location: PACU  Anesthesia Type:General  Level of Consciousness: drowsy and patient cooperative  Airway & Oxygen Therapy: Patient Spontanous Breathing and Patient connected to nasal cannula oxygen  Post-op Assessment: Report given to RN and Post -op Vital signs reviewed and stable  Post vital signs: Reviewed and stable  Last Vitals:  Vitals Value Taken Time  BP 118/76 09/25/20 1654  Temp 36.6 C 09/25/20 1654  Pulse 72 09/25/20 1654  Resp 17 09/25/20 1654  SpO2 98 % 09/25/20 1654    Last Pain:  Vitals:   09/25/20 1654  TempSrc: Oral  PainSc:       Patients Stated Pain Goal: 3 (09/25/20 0728)  Complications: No notable events documented.

## 2020-09-25 NOTE — H&P (Signed)
[name] is an 40 y.o. female.   Chief Complaint: Right neck mass HPI: Patient evaluated in the emergency department with acute swelling in the right neck.  CT scan at that time showed a soft tissue mass consistent with possible branchial cleft cyst.  Patient treated with appropriate antibiotic therapy with resolution of acute infection.  Follow-up evaluation showed continued swelling and fullness in the right neck.  Past Medical History:  Diagnosis Date   Anemia    Gestational diabetes    Gestational Hypertension    gestational only   Headache     Past Surgical History:  Procedure Laterality Date   DILATION AND CURETTAGE OF UTERUS     EYE SURGERY     left eye- repair of lazy eye when age 5   TONSILLECTOMY  2017    Family History  Problem Relation Age of Onset   Hyperlipidemia Mother    Social History:  reports that she has been smoking cigarettes. She has never used smokeless tobacco. She reports that she does not drink alcohol and does not use drugs.  Allergies: No Known Allergies  Medications Prior to Admission  Medication Sig Dispense Refill   ibuprofen (ADVIL,MOTRIN) 800 MG tablet Take 1 tablet (800 mg total) by mouth every 8 (eight) hours as needed. 30 tablet 0   lidocaine (XYLOCAINE) 2 % solution Use as directed 15 mLs in the mouth or throat every 3 (three) hours as needed for mouth pain. 100 mL 0    Results for orders placed or performed during the hospital encounter of 09/23/20 (from the past 48 hour(s))  CBC per protocol     Status: Abnormal   Collection Time: 09/23/20 11:57 AM  Result Value Ref Range   WBC 4.8 4.0 - 10.5 K/uL   RBC 5.26 (H) 3.87 - 5.11 MIL/uL   Hemoglobin 14.7 12.0 - 15.0 g/dL   HCT 03.7 09.6 - 43.8 %   MCV 87.3 80.0 - 100.0 fL   MCH 27.9 26.0 - 34.0 pg   MCHC 32.0 30.0 - 36.0 g/dL   RDW 38.1 84.0 - 37.5 %   Platelets 353 150 - 400 K/uL   nRBC 0.0 0.0 - 0.2 %    Comment: Performed at [facility], [address],  [place], [place] [address]  SARS CORONAVIRUS 2 (TAT 6-24 HRS) Nasopharyngeal Nasopharyngeal Swab     Status: None   Collection Time: 09/23/20 12:07 PM   Specimen: Nasopharyngeal Swab  Result Value Ref Range   SARS Coronavirus 2 NEGATIVE NEGATIVE    Comment: (NOTE) SARS-CoV-2 target nucleic acids are NOT DETECTED.  The SARS-CoV-2 RNA is generally detectable in upper and lower respiratory specimens during the acute phase of infection. Negative results do not preclude SARS-CoV-2 infection, do not rule out co-infections with other pathogens, and should not be used as the sole basis for treatment or other patient management decisions. Negative results must be combined with clinical observations, patient history, and epidemiological information. The expected result is Negative.  Fact Sheet for Patients: HairSlick.no  Fact Sheet for Healthcare Providers: quierodirigir.com  This test is not yet approved or cleared by the [place] FDA and  has been authorized for detection and/or diagnosis of SARS-CoV-2 by FDA under an Emergency Use Authorization (EUA). This EUA will remain  in effect (meaning this test can be used) for the duration of the COVID-19 declaration under Se ction 564(b)(1) of the Act, 21 U.S.C. section 360bbb-3(b)(1), unless the authorization is terminated or revoked  sooner.  Performed at [facility], [address], [place], [place] [address]    No results found.  Review of Systems  HENT:         Neck mass  Respiratory: Negative.    Cardiovascular: Negative.    Blood pressure 137/81, pulse 74, temperature 98.3 F (36.8 C), resp. rate 18, height 5\' 6"  (1.676 m), weight 100.7 kg, last menstrual period 09/22/2020, SpO2 100 %. Physical Exam Constitutional:      Appearance: She is normal weight.  Neck:     Comments: Right soft tissue neck mass, nontender Cardiovascular:     Rate and Rhythm: Normal rate.      Pulses: Normal pulses.  Pulmonary:     Effort: Pulmonary effort is normal.  Neurological:     Mental Status: She is alert.     Assessment/Plan Patient admitted for excision of right neck mass under general anesthesia as an outpatient with overnight observation.  09/24/2020, MD 09/25/2020, 8:16 AM

## 2020-09-25 NOTE — Anesthesia Postprocedure Evaluation (Signed)
Anesthesia Post Note  Patient: [name]  Procedure(s) Performed: Excision of Right Branchial Cyst (Right: Neck)     Patient location during evaluation: PACU Anesthesia Type: General Level of consciousness: awake Pain management: pain level controlled Vital Signs Assessment: post-procedure vital signs reviewed and stable Respiratory status: spontaneous breathing, nonlabored ventilation, respiratory function stable and patient connected to nasal cannula oxygen Cardiovascular status: blood pressure returned to baseline and stable Postop Assessment: no apparent nausea or vomiting Anesthetic complications: no   No notable events documented.  Last Vitals:  Vitals:   09/25/20 1312 09/25/20 1654  BP: 123/78 118/76  Pulse: 71 72  Resp: 16 17  Temp: 36.9 C 36.6 C  SpO2: 100% 98%    Last Pain:  Vitals:   09/25/20 1654  TempSrc: Oral  PainSc:                  [name] [name]

## 2020-09-25 NOTE — Anesthesia Preprocedure Evaluation (Signed)
Anesthesia Evaluation  Patient identified by MRN, date of birth, ID band Patient awake    Reviewed: Allergy & Precautions, NPO status , Patient's Chart, lab work & pertinent test results  Airway Mallampati: II  TM Distance: >3 FB Neck ROM: Full    Dental  (+) Chipped,    Pulmonary Current Smoker and Patient abstained from smoking.,    Pulmonary exam normal breath sounds clear to auscultation       Cardiovascular negative cardio ROS Normal cardiovascular exam Rhythm:Regular Rate:Normal     Neuro/Psych  Headaches, negative psych ROS   GI/Hepatic negative GI ROS, Neg liver ROS,   Endo/Other  negative endocrine ROS  Renal/GU negative Renal ROS     Musculoskeletal negative musculoskeletal ROS (+)   Abdominal (+) + obese,   Peds  Hematology negative hematology ROS (+)   Anesthesia Other Findings Branchial Cleft Cyst  Reproductive/Obstetrics hcg negative                             Anesthesia Physical Anesthesia Plan  ASA: 2  Anesthesia Plan: General   Post-op Pain Management:    Induction: Intravenous  PONV Risk Score and Plan: 3 and Ondansetron, Dexamethasone, Midazolam and Treatment may vary due to age or medical condition  Airway Management Planned: Oral ETT  Additional Equipment:   Intra-op Plan:   Post-operative Plan: Extubation in OR  Informed Consent: I have reviewed the patients History and Physical, chart, labs and discussed the procedure including the risks, benefits and alternatives for the proposed anesthesia with the patient or authorized representative who has indicated his/her understanding and acceptance.     Dental advisory given  Plan Discussed with: CRNA  Anesthesia Plan Comments:         Anesthesia Quick Evaluation

## 2020-09-25 NOTE — Progress Notes (Signed)
ENT Post Operative Note  Subjective: Patient seen and examined at bedside. Reports feeling tired and weak. States pain is controlled. Does not have a good appetite, had a popsicle. Mild nausea, no emesis.   Vitals:   09/25/20 1245 09/25/20 1312  BP: 129/71 123/78  Pulse: 82 71  Resp: 13 16  Temp:  98.5 F (36.9 C)  SpO2: 98% 100%     OBJECTIVE  Gen: alert, cooperative, appropriate Head/ENT: EOMI, neck supple, mucus membranes moist and pink, conjunctiva clear Right neck Incision c/d/I with dermabond overlying, no evidence of seroma or hematoma. JP drain exiting superiorly with <10cc serosanguinous drainage.  Face moves symmetrically Respiratory: Voice without dysphonia. non-labored breathing, no accessory muscle use, normal HR, good O2 saturations  ASSESS/ PLAN  [name] is a 40 y.o. female who is POD 0 from Excision of right deep neck mass.  -Continue pain control -MIVF- will saline lock when tolerating PO intake -wound care, routine -Empty, record output, recharge JP drain -Continue to monitor -Plan for drain removal and discharge tomorrow pending continued clinical stability   Please do not hesitate to contact me with any questions or concerns.   Laren Boom, DO Otolaryngology [facility] Cell: [phone]

## 2020-09-26 ENCOUNTER — Encounter (HOSPITAL_COMMUNITY): Payer: Self-pay | Admitting: Otolaryngology

## 2020-09-26 DIAGNOSIS — Q180 Sinus, fistula and cyst of branchial cleft: Secondary | ICD-10-CM | POA: Diagnosis not present

## 2020-09-26 MED ORDER — DOCUSATE SODIUM 100 MG PO CAPS: 100.0000 mg | ORAL_CAPSULE | Freq: Two times a day (BID) | ORAL | 0 refills | Status: AC | PRN

## 2020-09-26 NOTE — Discharge Summary (Signed)
Physician Discharge Summary  Patient ID: [name] MRN: [id number] DOB/AGE: [birth year] 40 y.o.  Admit date: 09/25/2020 Discharge date: 09/26/2020  Admission Diagnoses:  Principal Problem:   Neck mass Active Problems:   Branchial cleft cyst   Discharge Diagnoses:  Same  Surgeries: Procedure(s): Excision of Right Branchial Cyst on 09/25/2020   Consultants: None  Discharged Condition: Improved  Hospital Course: [name] is an 40 y.o. female who was admitted 09/25/2020 with a diagnosis of right Neck mass.  They were brought to the operating room on 09/25/2020 and underwent the above named procedures.  Patient was admitted for routine overnight observation. Her hospital course was uneventful. She tolerated a clear liquid diet without nausea or vomiting and reported control of pain with medication, and was determined stable for discharge.   Physical Exam:  General: Awake and alert, no acute distress Neck: Right neck incision looks excellent with dermabond overlying. No evidence of seroma or hematoma. JP drain with scant SS output, pulled without issue Neuro: CN II-XII intact Respiratory: Respiratory effort is normal on room air.  Recent vital signs:  Vitals:   09/26/20 0609 09/26/20 0834  BP: 128/70 122/79  Pulse: 65 60  Resp: 18 18  Temp: 98 F (36.7 C) 97.6 F (36.4 C)  SpO2: 100% 100%    Recent laboratory studies:  Results for orders placed or performed during the hospital encounter of 09/25/20  Pregnancy, urine POC  Result Value Ref Range   Preg Test, Ur NEGATIVE NEGATIVE    Discharge Medications:   Allergies as of 09/26/2020   No Known Allergies      Medication List     TAKE these medications    docusate sodium 100 MG capsule Commonly known as: Colace Take 1 capsule (100 mg total) by mouth 2 (two) times daily as needed for up to 10 days.   HYDROcodone-acetaminophen 5-325 MG tablet Commonly known as: Norco Take 1-2 tablets by mouth every 6  (six) hours as needed for up to 5 days.   ibuprofen 800 MG tablet Commonly known as: ADVIL Take 1 tablet (800 mg total) by mouth every 8 (eight) hours as needed.   lidocaine 2 % solution Commonly known as: XYLOCAINE Use as directed 15 mLs in the mouth or throat every 3 (three) hours as needed for mouth pain.               Discharge Care Instructions  (From admission, onward)           Start     Ordered   09/26/20 0000  Discharge wound care:       Comments: Can replace gauze as needed, or remove if no drainage from drain site. Keep dry for 24 hours, can bathe tomorrow   09/26/20 0954            Diagnostic Studies: No results found.  Disposition: Discharge disposition: 01-Home or Self Care      Discharge Instructions     Diet - low sodium heart healthy   Complete by: As directed    Discharge instructions   Complete by: As directed    1. Limited activity 2. Liquid and soft diet, advance as tolerated 3. May bathe and shower day after surgery 4. Wound care - Gentle cleaning with soap and water 5. DO NOT APPLY ANY OINTMENT 6. Elevate Head of Bed  Please contact [facility] [phone]) for any additional concerns.   Discharge wound care:   Complete by: As directed  Can replace gauze as needed, or remove if no drainage from drain site. Keep dry for 24 hours, can bathe tomorrow   Increase activity slowly   Complete by: As directed         Follow-up Information     Osborn Coho, MD. Schedule an appointment as soon as possible for a visit in 2 week(s).   Specialty: Otolaryngology Contact information: [address] [place] [address] [phone]                  Signed: Laren Boom 09/26/2020, 9:54 AM

## 2020-09-28 LAB — SURGICAL PATHOLOGY

## 2020-11-03 ENCOUNTER — Encounter (HOSPITAL_COMMUNITY): Payer: Self-pay | Admitting: *Deleted

## 2020-11-03 ENCOUNTER — Ambulatory Visit (HOSPITAL_COMMUNITY)
Admission: EM | Admit: 2020-11-03 | Discharge: 2020-11-03 | Disposition: A | Discharge status: Home / Self Care | Payer: Commercial Managed Care - PPO | Attending: Emergency Medicine | Admitting: Emergency Medicine

## 2020-11-03 ENCOUNTER — Other Ambulatory Visit: Payer: Self-pay

## 2020-11-03 DIAGNOSIS — S161XXA Strain of muscle, fascia and tendon at neck level, initial encounter: Secondary | ICD-10-CM | POA: Diagnosis not present

## 2020-11-03 MED ORDER — IBUPROFEN 800 MG PO TABS: 800.0000 mg | ORAL_TABLET | Freq: Three times a day (TID) | ORAL | 0 refills | Status: DC | PRN

## 2020-11-03 MED ORDER — TIZANIDINE HCL 4 MG PO TABS: 4.0000 mg | ORAL_TABLET | Freq: Four times a day (QID) | ORAL | 0 refills | Status: DC | PRN
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
## 2020-11-03 NOTE — Discharge Instructions (Addendum)
You can take the ibuprofen 3 times a day as needed for pain.  You can also take Tylenol. You can take the tizanidine every 6 hours as needed for muscle pain and spasms.  Do not take the tizanidine before driving as it can make you sleepy.  You can try heat, ice, or alternate between heat and ice for comfort.  You can also try IcyHot, lidocaine patches, or Biofreeze as needed.  Rest and drink plenty of fluids especially water.  Do gentle stretching and exercises periodically throughout the day.  Return or go to the Emergency Department if symptoms worsen or do not improve in the next few days.

## 2021-01-25 ENCOUNTER — Emergency Department (HOSPITAL_COMMUNITY)
Admission: EM | Admit: 2021-01-25 | Discharge: 2021-01-25 | Disposition: A | Discharge status: Home / Self Care | Payer: Worker's Compensation | Attending: Emergency Medicine | Admitting: Emergency Medicine

## 2021-01-25 ENCOUNTER — Emergency Department (HOSPITAL_BASED_OUTPATIENT_CLINIC_OR_DEPARTMENT_OTHER): Payer: Worker's Compensation

## 2021-01-25 ENCOUNTER — Encounter (HOSPITAL_COMMUNITY): Payer: Self-pay

## 2021-01-25 ENCOUNTER — Emergency Department (HOSPITAL_COMMUNITY): Payer: Worker's Compensation

## 2021-01-25 ENCOUNTER — Other Ambulatory Visit: Payer: Self-pay

## 2021-01-25 DIAGNOSIS — M7989 Other specified soft tissue disorders: Secondary | ICD-10-CM

## 2021-01-25 DIAGNOSIS — S6991XA Unspecified injury of right wrist, hand and finger(s), initial encounter: Secondary | ICD-10-CM | POA: Diagnosis present

## 2021-01-25 DIAGNOSIS — W1839XA Other fall on same level, initial encounter: Secondary | ICD-10-CM | POA: Insufficient documentation

## 2021-01-25 DIAGNOSIS — M79604 Pain in right leg: Secondary | ICD-10-CM | POA: Diagnosis not present

## 2021-01-25 DIAGNOSIS — Y9302 Activity, running: Secondary | ICD-10-CM | POA: Diagnosis not present

## 2021-01-25 DIAGNOSIS — F17210 Nicotine dependence, cigarettes, uncomplicated: Secondary | ICD-10-CM | POA: Insufficient documentation

## 2021-01-25 DIAGNOSIS — S82001A Unspecified fracture of right patella, initial encounter for closed fracture: Secondary | ICD-10-CM

## 2021-01-25 MED ORDER — OXYCODONE-ACETAMINOPHEN 5-325 MG PO TABS
1.0000 | ORAL_TABLET | Freq: Once | ORAL | Status: AC
  Administered 2021-01-25: 1 via ORAL
  Filled 2021-01-25: qty 1

## 2021-01-25 MED ORDER — OXYCODONE-ACETAMINOPHEN 5-325 MG PO TABS: 1.0000 | ORAL_TABLET | Freq: Three times a day (TID) | ORAL | 0 refills | Status: DC | PRN

## 2021-01-25 MED ORDER — KETOROLAC TROMETHAMINE 30 MG/ML IJ SOLN
30.0000 mg | Freq: Once | INTRAMUSCULAR | Status: AC
  Administered 2021-01-25: 30 mg via INTRAMUSCULAR
  Filled 2021-01-25: qty 1

## 2021-01-25 NOTE — ED Provider Notes (Signed)
[facility]PT Provider Note   CSN: [id number] Arrival date & time: 01/25/21  1029     History Chief Complaint  Patient presents with   Fall   Knee Injury    [name] is a 40 y.o. female.   Fall Pertinent negatives include no chest pain and no shortness of breath. Patient presents with right knee pain.  Uncontrolled.  Yesterday had a fall when running and landed onto her right knee.  Saw urgent care and told she had a patella fracture.  Given knee immobilizer and plan for Ortho follow-up.  Worsening pain today.  Reportedly was not given pain medicine for home.  No numbness or weakness.  States she does not feel as if the immobilizer is working very well.  Pain is mostly in the right knee.  No hip or ankle pain.  States the leg feels colder.     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   IUD (intrauterine device) in place 05/21/2018   Low vitamin D level 01/04/2017   History of preterm delivery 12/30/2016    Past Surgical History:  Procedure Laterality Date   DILATION AND CURETTAGE OF UTERUS     EAR CYST EXCISION Right 09/25/2020   Procedure: Excision of Right Branchial Cyst;  Surgeon: Osborn Coho, MD;  Location: [facility] OR;  Service: ENT;  Laterality: Right;   EYE SURGERY     left eye- repair of lazy eye when age 14   TONSILLECTOMY  2017     OB History     Gravida  15   Para  5   Term  2   Preterm  3   AB  10   Living  4      SAB  1   IAB  9   Ectopic      Multiple  0   Live Births  4           Family History  Problem Relation Age of Onset   Hyperlipidemia Mother     Social History   Tobacco Use   Smoking status: Every Day    Packs/day: 0.25    Types: Cigarettes   Smokeless tobacco: Never   Vaping Use   Vaping Use: Never used  Substance Use Topics   Alcohol use: No    Comment: occ   Drug use: No    Home Medications Prior to Admission medications   Medication Sig Start Date End Date Taking? Authorizing Provider  oxyCODONE-acetaminophen (PERCOCET/ROXICET) 5-325 MG tablet Take 1-2 tablets by mouth every 8 (eight) hours as needed for severe pain. 01/25/21  Yes Benjiman Core, MD  ibuprofen (ADVIL) 800 MG tablet Take 1 tablet (800 mg total) by mouth every 8 (eight) hours as needed. 11/03/20   Ivette Loyal, NP  lidocaine (XYLOCAINE) 2 % solution Use as directed 15 mLs in the mouth or throat every 3 (three) hours as needed for mouth pain. 05/26/20   McDonald, Mia A, PA-C  tiZANidine (ZANAFLEX) 4 MG tablet Take 1 tablet (4 mg total) by mouth every 6 (six) hours as needed for muscle spasms. 11/03/20   Ivette Loyal, NP    Allergies    Patient has no known allergies.  Review of Systems   Review of Systems  Constitutional:  Negative for appetite change.  HENT:  Negative for congestion.   Respiratory:  Negative for shortness of breath.   Cardiovascular:  Negative for chest pain.  Genitourinary:  Negative for flank pain.  Musculoskeletal:  Negative for joint swelling.       Right knee pain.  Right lower leg swelling.  Skin:  Negative for rash.  Neurological:  Negative for weakness.  Psychiatric/Behavioral:  Negative for confusion.    Physical Exam Updated Vital Signs BP 137/86   Pulse 70   Temp 98.6 F (37 C) (Oral)   Resp 18   Ht 5\' 6"  (1.676 m)   Wt 98.4 kg   LMP 01/11/2021 (Approximate)   SpO2 99%   BMI 35.02 kg/m   Physical Exam Vitals and nursing note reviewed.  HENT:     Head: Normocephalic.  Eyes:     Extraocular Movements: Extraocular movements intact.  Pulmonary:     Breath sounds: No wheezing.  Abdominal:     Tenderness: There is no abdominal tenderness.  Musculoskeletal:        General: Tenderness present.     Cervical back: Neck supple.      Comments: Tenderness over right knee anteriorly.  Mild swelling.  There is edema both somewhat proximally and distal to this.  Dorsalis pedis pulse intact.  Mild coolness of the extremity compared to contralateral.  Skin:    Capillary Refill: Capillary refill takes less than 2 seconds.  Neurological:     Mental Status: She is alert and oriented to person, place, and time.    ED Results / Procedures / Treatments   Labs (all labs ordered are listed, but only abnormal results are displayed) Labs Reviewed - No data to display  EKG None  Radiology DG Knee Complete 4 Views Right  Result Date: 01/25/2021 CLINICAL DATA:  Fall, patellar fracture EXAM: RIGHT KNEE - COMPLETE 4+ VIEW COMPARISON:  None. FINDINGS: There is no dislocation. Joint effusion is present. There is a fracture through the lateral aspect of the patella with mild distraction. Joint spaces are preserved. IMPRESSION: Acute fracture of the lateral patella with mild distraction. Joint effusion is present. Electronically Signed   By: 01/27/2021 M.D.   On: 01/25/2021 11:54   VAS 01/27/2021 LOWER EXTREMITY VENOUS (DVT) (ONLY [facility])  Result Date: 01/25/2021  Lower Venous DVT Study Patient Name:  [name]  Date of Exam:   01/25/2021 Medical Rec #: 01/27/2021        Accession #:    [id number] Date of Birth: 1980-10-24       Patient Gender: F Patient Age:   75 years Exam Location:  [address] [facility] Procedure:      [facility] LOWER EXTREMITY VENOUS (DVT) Referring Phys: [place] --------------------------------------------------------------------------------  Indications: Pain, and Swelling.  Risk Factors: Trauma Fall on 01/24/21 with RLE patella fracture. Comparison Study: No previous exams Performing Technologist: Jody Hill RVT, RDMS  Examination Guidelines: A complete evaluation includes B-mode imaging, spectral Doppler, color Doppler, and power Doppler as needed of all accessible portions of each vessel. Bilateral testing is  considered an integral part of a complete examination. Limited examinations for reoccurring indications may be performed as noted. The reflux portion of the exam is performed with the patient in reverse Trendelenburg.  +---------+---------------+---------+-----------+----------+--------------+ RIGHT    CompressibilityPhasicitySpontaneityPropertiesThrombus Aging +---------+---------------+---------+-----------+----------+--------------+ CFV      Full  Yes      Yes                                 +---------+---------------+---------+-----------+----------+--------------+ SFJ      Full                                                        +---------+---------------+---------+-----------+----------+--------------+ FV Prox  Full           Yes      Yes                                 +---------+---------------+---------+-----------+----------+--------------+ FV Mid   Full           Yes      Yes                                 +---------+---------------+---------+-----------+----------+--------------+ FV DistalFull           Yes      Yes                                 +---------+---------------+---------+-----------+----------+--------------+ PFV      Full                                                        +---------+---------------+---------+-----------+----------+--------------+ POP      Full           Yes      Yes                                 +---------+---------------+---------+-----------+----------+--------------+ PTV      Full                                                        +---------+---------------+---------+-----------+----------+--------------+ PERO     Full                                                        +---------+---------------+---------+-----------+----------+--------------+   +----+---------------+---------+-----------+----------+--------------+  LEFTCompressibilityPhasicitySpontaneityPropertiesThrombus Aging +----+---------------+---------+-----------+----------+--------------+ CFV Full           Yes      Yes                                 +----+---------------+---------+-----------+----------+--------------+     Summary: RIGHT: - There is no evidence of deep vein thrombosis in the lower extremity. - There is no evidence of superficial venous thrombosis.  - No cystic  structure found in the popliteal fossa.  LEFT: - No evidence of common femoral vein obstruction.  *See table(s) above for measurements and observations. Electronically signed by Lemar Livings MD on 01/25/2021 at 4:11:20 PM.    Final     Procedures Procedures   Medications Ordered in ED Medications  ketorolac (TORADOL) 30 MG/ML injection 30 mg (30 mg Intramuscular Given 01/25/21 1150)  oxyCODONE-acetaminophen (PERCOCET/ROXICET) 5-325 MG per tablet 1 tablet (1 tablet Oral Given 01/25/21 1150)    ED Course  I have reviewed the triage vital signs and the nursing notes.  Pertinent labs & imaging results that were available during my care of the patient were reviewed by me and considered in my medical decision making (see chart for details).    MDM Rules/Calculators/A&P                          Patient with fall.  Patellar fracture.  X-ray done at urgent care but not available here.  X-ray done here and does show lateral patellar fracture.  Doubt other severe injury of the knee.  Ultrasound done and shows no clot.  Will discharge home.  Pain medicine given and had not been given any from the urgent care.  Follow-up with either with our orthopedic surgeons or the ones that urgent care was arranging knee immobilizer given.  Doubt arterial injury Final Clinical Impression(s) / ED Diagnoses Final diagnoses:  Closed nondisplaced fracture of right patella, unspecified fracture morphology, initial encounter    Rx / DC Orders ED Discharge Orders          Ordered     oxyCODONE-acetaminophen (PERCOCET/ROXICET) 5-325 MG tablet  Every 8 hours PRN        01/25/21 1250             Benjiman Core, MD 01/25/21 1637

## 2021-01-25 NOTE — Progress Notes (Signed)
RLE venous duplex has been completed.  Preliminary results given to Dr. Rubin Payor.   Results can be found under chart review under CV PROC. 01/25/2021 1:27 PM Evelio Rueda RVT, RDMS

## 2021-01-25 NOTE — ED Triage Notes (Signed)
Patient had a fall yesterday and went to an UC. Patient states she was told she had a right patella fracture. Patient c/o increased swelling and feeling like her right leg is "ice cold." Patient has a right pedal pulse and temperature is less than left leg, but warm.

## 2021-01-25 NOTE — Discharge Instructions (Addendum)
Follow-up with either the orthopedic surgeon listed or with the orthopedic surgery at the urgent care has arranged.

## 2021-09-21 DIAGNOSIS — R635 Abnormal weight gain: Secondary | ICD-10-CM | POA: Diagnosis not present

## 2021-09-21 DIAGNOSIS — E663 Overweight: Secondary | ICD-10-CM | POA: Diagnosis not present

## 2021-09-21 DIAGNOSIS — Z113 Encounter for screening for infections with a predominantly sexual mode of transmission: Secondary | ICD-10-CM | POA: Diagnosis not present

## 2021-09-21 DIAGNOSIS — Z124 Encounter for screening for malignant neoplasm of cervix: Secondary | ICD-10-CM | POA: Diagnosis not present

## 2021-09-21 DIAGNOSIS — R87618 Other abnormal cytological findings on specimens from cervix uteri: Secondary | ICD-10-CM | POA: Diagnosis not present

## 2021-09-21 DIAGNOSIS — Z13228 Encounter for screening for other metabolic disorders: Secondary | ICD-10-CM | POA: Diagnosis not present

## 2021-09-21 DIAGNOSIS — Z13220 Encounter for screening for lipoid disorders: Secondary | ICD-10-CM | POA: Diagnosis not present

## 2021-09-21 DIAGNOSIS — Z0000 Encounter for general adult medical examination without abnormal findings: Secondary | ICD-10-CM | POA: Diagnosis not present

## 2021-12-17 ENCOUNTER — Encounter (HOSPITAL_COMMUNITY): Payer: Self-pay

## 2021-12-17 ENCOUNTER — Emergency Department (HOSPITAL_COMMUNITY)
Admission: EM | Admit: 2021-12-17 | Discharge: 2021-12-17 | Disposition: A | Discharge status: Home / Self Care | Payer: Medicaid Other | Attending: Emergency Medicine | Admitting: Emergency Medicine

## 2021-12-17 ENCOUNTER — Other Ambulatory Visit: Payer: Self-pay

## 2021-12-17 DIAGNOSIS — R519 Headache, unspecified: Secondary | ICD-10-CM | POA: Diagnosis not present

## 2021-12-17 DIAGNOSIS — M542 Cervicalgia: Secondary | ICD-10-CM | POA: Diagnosis not present

## 2021-12-17 DIAGNOSIS — Y92410 Unspecified street and highway as the place of occurrence of the external cause: Secondary | ICD-10-CM | POA: Insufficient documentation

## 2021-12-17 MED ORDER — IBUPROFEN 800 MG PO TABS
800.0000 mg | ORAL_TABLET | Freq: Once | ORAL | Status: AC
  Administered 2021-12-17: 800 mg via ORAL
  Filled 2021-12-17: qty 1

## 2021-12-17 MED ORDER — CYCLOBENZAPRINE HCL 10 MG PO TABS: 10.0000 mg | ORAL_TABLET | Freq: Two times a day (BID) | ORAL | 0 refills | Status: DC | PRN

## 2021-12-17 MED ORDER — IBUPROFEN 600 MG PO TABS: 600.0000 mg | ORAL_TABLET | Freq: Four times a day (QID) | ORAL | 0 refills | Status: DC | PRN
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
## 2021-12-22 NOTE — ED Notes (Signed)
Opened chart at pts request to reprint work note.

## 2022-01-04 DIAGNOSIS — F17210 Nicotine dependence, cigarettes, uncomplicated: Secondary | ICD-10-CM | POA: Diagnosis not present

## 2022-01-04 DIAGNOSIS — E782 Mixed hyperlipidemia: Secondary | ICD-10-CM | POA: Diagnosis not present

## 2022-01-04 DIAGNOSIS — M545 Low back pain, unspecified: Secondary | ICD-10-CM | POA: Diagnosis not present

## 2022-01-04 DIAGNOSIS — N644 Mastodynia: Secondary | ICD-10-CM | POA: Diagnosis not present

## 2022-02-23 ENCOUNTER — Encounter (HOSPITAL_COMMUNITY): Payer: Self-pay

## 2022-02-23 ENCOUNTER — Ambulatory Visit (HOSPITAL_COMMUNITY)
Admission: EM | Admit: 2022-02-23 | Discharge: 2022-02-23 | Disposition: A | Discharge status: Home / Self Care | Payer: Commercial Managed Care - PPO | Attending: Nurse Practitioner | Admitting: Nurse Practitioner

## 2022-02-23 DIAGNOSIS — Z1152 Encounter for screening for COVID-19: Secondary | ICD-10-CM | POA: Diagnosis not present

## 2022-02-23 DIAGNOSIS — R051 Acute cough: Secondary | ICD-10-CM | POA: Insufficient documentation

## 2022-02-23 DIAGNOSIS — Z20828 Contact with and (suspected) exposure to other viral communicable diseases: Secondary | ICD-10-CM | POA: Insufficient documentation

## 2022-02-23 DIAGNOSIS — H9201 Otalgia, right ear: Secondary | ICD-10-CM | POA: Insufficient documentation

## 2022-02-23 LAB — POC INFLUENZA A AND B ANTIGEN (URGENT CARE ONLY)
INFLUENZA A ANTIGEN, POC: NEGATIVE
INFLUENZA B ANTIGEN, POC: NEGATIVE

## 2022-02-23 LAB — POCT RAPID STREP A, ED / UC: Streptococcus, Group A Screen (Direct): NEGATIVE

## 2022-02-23 MED ORDER — PROMETHAZINE-DM 6.25-15 MG/5ML PO SYRP: 5.0000 mL | ORAL_SOLUTION | Freq: Four times a day (QID) | ORAL | 0 refills | Status: DC | PRN

## 2022-02-23 NOTE — Discharge Instructions (Addendum)
Your COVID is pending. A nurse will contact you if they COVID test is positive. The results will show in your myChart.  Your Influenza and Strep Test are negative. You may have an Upper Respiratory Infection We encourage conservative treatment for symptom relief. You can continue the Theraflu or try Mucinex  We encourage you to use Tylenol alternating with Ibuprofen for your fever if not contraindicated. (Remember to use as directed do not exceed daily dosing recommendations) We also encourage salt water gargles for your sore throat. You should also consider throat lozenges and chloraseptic spray.  Your cough can be soothed with a cough suppressant. We have prescribed you a cough suppressant to be taken as  directed; Promethazine 6.25/15 mg/4ml every 4 hours as needed

## 2022-02-23 NOTE — ED Triage Notes (Signed)
Pt is here for cough, sore throat, body aches, chills, bilateral ear pain , low energy, headaches , dizziness, vomiting , nausea runny nose , nasal congestion and loss of appetite x1day

## 2022-02-23 NOTE — ED Provider Notes (Signed)
[facility]    CSN: [id number] Arrival date & time: 02/23/22  1014      History   Chief Complaint No chief complaint on file.   HPI [name] is a 41 y.o. female.   HPI She is in today complaining of headache and dizziness, chills, cough, sore throat, body aches, ear pain, nasal congestion,  runny nose. Denies shortness of breath, chest pain, nausea, or diarrhea. This has been going on for 1 days. Exposure positive for unknown exposures. 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   IUD (intrauterine device) in place 05/21/2018   Low vitamin D level 01/04/2017   History of preterm delivery 12/30/2016    Past Surgical History:  Procedure Laterality Date   DILATION AND CURETTAGE OF UTERUS     EAR CYST EXCISION Right 09/25/2020   Procedure: Excision of Right Branchial Cyst;  Surgeon: Osborn Coho, MD;  Location: [facility] OR;  Service: ENT;  Laterality: Right;   EYE SURGERY     left eye- repair of lazy eye when age 16   TONSILLECTOMY  2017    OB History     Gravida  15   Para  5   Term  2   Preterm  3   AB  10   Living  4      SAB  1   IAB  9   Ectopic      Multiple  0   Live Births  4            Home Medications    Prior to Admission medications   Medication Sig Start Date End Date Taking? Authorizing Provider  promethazine-dextromethorphan (PROMETHAZINE-DM) 6.25-15 MG/5ML syrup Take 5 mLs by mouth 4 (four) times daily as needed for cough. 02/23/22  Yes Barbette Merino, NP  ibuprofen (ADVIL) 600 MG tablet Take 1 tablet (600 mg total) by mouth every 6 (six) hours as needed. 12/17/21   Fayrene Helper, PA-C  oxyCODONE-acetaminophen (PERCOCET/ROXICET) 5-325 MG tablet Take 1-2 tablets by  mouth every 8 (eight) hours as needed for severe pain. 01/25/21   Benjiman Core, MD    Family History Family History  Problem Relation Age of Onset   Hyperlipidemia Mother     Social History Social History   Tobacco Use   Smoking status: Every Day    Packs/day: 0.25    Types: Cigarettes   Smokeless tobacco: Never  Vaping Use   Vaping Use: Never used  Substance Use Topics   Alcohol use: No    Comment: occ   Drug use: No     Allergies   Patient has no known allergies.   Review of Systems Review of Systems   Physical Exam Triage Vital Signs ED Triage Vitals  Enc Vitals Group     BP 02/23/22 1305 126/83     Pulse Rate 02/23/22 1305 81     Resp 02/23/22 1305 16     Temp 02/23/22 1305 98.3 F (36.8 C)     Temp Source 02/23/22 1305 Oral     SpO2 02/23/22 1305 100 %     Weight --      Height --  Head Circumference --      Peak Flow --      Pain Score 02/23/22 1307 7     Pain Loc --      Pain Edu? --      Excl. in GC? --    No data found.  Updated Vital Signs BP 126/83 (BP Location: Left Arm)   Pulse 81   Temp 98.3 F (36.8 C) (Oral)   Resp 16   SpO2 100%   Visual Acuity Right Eye Distance:   Left Eye Distance:   Bilateral Distance:    Right Eye Near:   Left Eye Near:    Bilateral Near:     Physical Exam HENT:     Head: Normocephalic.     Right Ear: Tympanic membrane normal. There is no impacted cerumen.     Left Ear: Tympanic membrane normal. There is no impacted cerumen.     Nose: Nose normal.     Mouth/Throat:     Comments: Uvula not visible slight deformity to throat Cardiovascular:     Rate and Rhythm: Normal rate.     Pulses: Normal pulses.  Pulmonary:     Effort: Pulmonary effort is normal.  Musculoskeletal:        General: Normal range of motion.     Cervical back: Normal range of motion.  Skin:    General: Skin is warm and dry.     Capillary Refill: Capillary refill takes less than 2 seconds.  Neurological:      General: No focal deficit present.     Mental Status: She is alert and oriented to person, place, and time.  Psychiatric:        Mood and Affect: Mood normal.        Behavior: Behavior normal.      UC Treatments / Results  Labs (all labs ordered are listed, but only abnormal results are displayed) Labs Reviewed  SARS CORONAVIRUS 2 (TAT 6-24 HRS)  POCT RAPID STREP A, ED / UC  POC INFLUENZA A AND B ANTIGEN (URGENT CARE ONLY)    EKG   Radiology No results found.  Procedures Procedures (including critical care time)  Medications Ordered in UC Medications - No data to display  Initial Impression / Assessment and Plan / UC Course  I have reviewed the triage vital signs and the nursing notes.  Pertinent labs & imaging results that were available during my care of the patient were reviewed by me and considered in my medical decision making (see chart for details).     Head congestion Final Clinical Impressions(s) / UC Diagnoses   Final diagnoses:  Acute cough  Right ear pain  Exposure to virus     Discharge Instructions      Your COVID is pending. A nurse will contact you if they COVID test is positive. The results will show in your myChart.  Your Influenza and Strep Test are negative. You may have an Upper Respiratory Infection We encourage conservative treatment for symptom relief. You can continue the Theraflu or try Mucinex  We encourage you to use Tylenol alternating with Ibuprofen for your fever if not contraindicated. (Remember to use as directed do not exceed daily dosing recommendations) We also encourage salt water gargles for your sore throat. You should also consider throat lozenges and chloraseptic spray.  Your cough can be soothed with a cough suppressant. We have prescribed you a cough suppressant to be taken as  directed; Promethazine 6.25/15 mg/62ml every 4 hours  as needed       ED Prescriptions     Medication Sig Dispense Auth. Provider    promethazine-dextromethorphan (PROMETHAZINE-DM) 6.25-15 MG/5ML syrup Take 5 mLs by mouth 4 (four) times daily as needed for cough. 200 mL Barbette Merino, NP      PDMP not reviewed this encounter.   Thad Ranger [place], [place] 02/23/22 [phone]

## 2022-02-24 LAB — SARS CORONAVIRUS 2 (TAT 6-24 HRS): SARS Coronavirus 2: NEGATIVE

## 2023-03-27 ENCOUNTER — Encounter (HOSPITAL_COMMUNITY): Payer: Self-pay

## 2023-03-27 ENCOUNTER — Ambulatory Visit (HOSPITAL_COMMUNITY)
Admission: EM | Admit: 2023-03-27 | Discharge: 2023-03-27 | Disposition: A | Discharge status: Home / Self Care | Payer: Self-pay | Attending: Family Medicine | Admitting: Family Medicine

## 2023-03-27 DIAGNOSIS — R519 Headache, unspecified: Secondary | ICD-10-CM

## 2023-03-27 MED ORDER — TIZANIDINE HCL 4 MG PO TABS: 4.0000 mg | ORAL_TABLET | Freq: Three times a day (TID) | ORAL | 0 refills | Status: AC | PRN

## 2023-03-27 MED ORDER — IBUPROFEN 800 MG PO TABS: 800.0000 mg | ORAL_TABLET | Freq: Three times a day (TID) | ORAL | 0 refills | Status: AC | PRN

## 2023-03-27 NOTE — Discharge Instructions (Addendum)
Take ibuprofen 800 mg--1 tab every 8 hours as needed for pain.   Take tizanidine 4 mg--1 every 8 hours as needed for muscle spasms; this medication can cause dizziness and sleepiness  Please follow-up with your primary care about this issue
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
## 2023-05-03 ENCOUNTER — Other Ambulatory Visit (HOSPITAL_COMMUNITY)
Admission: RE | Admit: 2023-05-03 | Discharge: 2023-05-03 | Disposition: A | Discharge status: Home / Self Care | Source: Ambulatory Visit | Attending: Advanced Practice Midwife | Admitting: Advanced Practice Midwife

## 2023-05-03 ENCOUNTER — Encounter: Payer: Self-pay | Admitting: Advanced Practice Midwife

## 2023-05-03 ENCOUNTER — Ambulatory Visit: Payer: Commercial Managed Care - PPO | Admitting: Advanced Practice Midwife

## 2023-05-03 VITALS — BP 123/84 | HR 76 | Wt 211.1 lb

## 2023-05-03 DIAGNOSIS — Z1231 Encounter for screening mammogram for malignant neoplasm of breast: Secondary | ICD-10-CM | POA: Diagnosis not present

## 2023-05-03 DIAGNOSIS — Z975 Presence of (intrauterine) contraceptive device: Secondary | ICD-10-CM

## 2023-05-03 DIAGNOSIS — Z113 Encounter for screening for infections with a predominantly sexual mode of transmission: Secondary | ICD-10-CM | POA: Insufficient documentation

## 2023-05-03 DIAGNOSIS — Z30433 Encounter for removal and reinsertion of intrauterine contraceptive device: Secondary | ICD-10-CM

## 2023-05-03 DIAGNOSIS — Z124 Encounter for screening for malignant neoplasm of cervix: Secondary | ICD-10-CM | POA: Insufficient documentation

## 2023-05-03 DIAGNOSIS — Z01419 Encounter for gynecological examination (general) (routine) without abnormal findings: Secondary | ICD-10-CM | POA: Diagnosis not present

## 2023-05-03 LAB — CERVICOVAGINAL ANCILLARY ONLY
Chlamydia: NEGATIVE
Comment: NEGATIVE
Comment: NEGATIVE
Comment: NORMAL
Neisseria Gonorrhea: NEGATIVE
Trichomonas: NEGATIVE

## 2023-05-03 MED ORDER — LEVONORGESTREL 20 MCG/DAY IU IUD
1.0000 | INTRAUTERINE_SYSTEM | Freq: Once | INTRAUTERINE | Status: AC
  Administered 2023-05-03: 1 via INTRAUTERINE

## 2023-05-03 NOTE — Progress Notes (Signed)
 Subjective:     [name] is a 43 y.o. female here at [facility] for a routine exam.  Current complaints: none.  Personal and family health history reviewed: no.  Do you have a primary care provider? yes Do you feel safe at home? yes  Flowsheet Row Nutrition from 10/25/2017 in [facility] Nutr Diab Ed  - A Dept Of . [facility]  PHQ-2 Total Score 0       Health Maintenance Due  Topic Date Due   Pneumococcal Vaccine 15-55 Years old (1 of 2 - PCV) Never done   Hepatitis C Screening  Never done   Cervical Cancer Screening (HPV/Pap Cotest)  12/31/2019   INFLUENZA VACCINE  Never done   COVID-19 Vaccine (1 - 2024-25 season) Never done     Risk factors for chronic health problems: Smoking: Alchohol/how much: Pt BMI: Body mass index is 34.07 kg/m.   Gynecologic History Patient's last menstrual period was 04/20/2023 (exact date). Contraception: IUD Last Pap: 2018. Results were: normal Last mammogram: n/a.   Obstetric History OB History  Gravida Para Term Preterm AB Living  15 5 2 3 10 4   SAB IAB Ectopic Multiple Live Births  1 9  0 4    # Outcome Date GA Lbr Len/2nd Weight Sex Type Anes PTL Lv  17 Preterm 03/20/18 [redacted]w[redacted]d 05:11 / 00:15 5 lb 3.6 oz (2.37 kg) F Vag-Spont None  LIV  14 Preterm 2019 [redacted]w[redacted]d  4 lb 3 oz (1.899 kg) M    FD     Birth Comments: Hypoplastic left heart syndrome  13 SAB 2010 [redacted]w[redacted]d         12 Term 05/24/07    M Vag-Spont   LIV  11 Term 04/01/00    F Vag-Spont   LIV  10 Preterm 05/22/98 [redacted]w[redacted]d   M Vag-Spont   LIV  9 IAB           8 IAB           7 IAB           6 IAB           5 IAB           4 IAB           3 IAB           2 IAB           1 IAB              The following portions of the patient's history were reviewed and updated as appropriate: allergies, current medications, past family history, past medical history, past social history, past surgical history, and problem list.  Review of Systems Pertinent items noted  in HPI and remainder of comprehensive ROS otherwise negative.    Objective:  BP 123/84   Pulse 76   Wt 211 lb 1.6 oz (95.8 kg)   LMP 04/20/2023 (Exact Date)   BMI 34.07 kg/m   VS reviewed, nursing note reviewed,  Constitutional: well developed, well nourished, no distress HEENT: normocephalic, thyroid without enlargement or mass HEART: RRR, no murmurs rubs/gallops RESP: clear and equal to auscultation bilaterally in all lobes  Breast Exam:   exam performed: right breast normal without mass, skin or nipple changes or axillary nodes, left breast normal without mass, skin or nipple changes or axillary nodes Abdomen: soft Neuro: alert and oriented x 3 Skin: warm, dry Psych: affect normal Pelvic exam: Performed:  Cervix pink, visually closed, without lesion, scant white creamy discharge, vaginal walls and external genitalia normal Bimanual exam: Cervix 0/long/high, firm, anterior, neg CMT, uterus nontender, nonenlarged, adnexa without tenderness, enlargement, or mass      IUD Removal  Patient was in the dorsal lithotomy position, normal external genitalia was noted.  A speculum was placed in the patient's vagina, normal discharge was noted, no lesions. The multiparous cervix was visualized, no lesions, no abnormal discharge.  The strings of the IUD were grasped and pulled using ring forceps. The IUD was removed in its entirety. Patient tolerated the procedure well.      IUD Procedure Note Patient identified, informed consent performed.  Discussed risks of irregular bleeding, cramping, infection, malpositioning or misplacement of the IUD outside the uterus which may require further procedures. Time out was performed.  Urine pregnancy test negative.  Speculum placed in the vagina.  Cervix visualized.  Cleaned with Betadine x 2.  Grasped anteriorly with a single tooth tenaculum.  Uterus sounded to 7 cm.  Mirena IUD placed per manufacturer's recommendations.  Strings trimmed to 3 cm. Tenaculum  was removed, good hemostasis noted.  Patient tolerated procedure well.   Patient was given post-procedure instructions and the Mirena care card with expiration date.  Patient was also asked to check IUD strings periodically and follow up in 4-6 weeks for IUD check.   Assessment/Plan:   1. Encounter for annual routine gynecological examination   2. Screening for cervical cancer (Primary)  - Cytology - PAP( [place])  3. Screening examination for STI  - Cervicovaginal ancillary only( [place])  4. Encounter for IUD removal and reinsertion --IUD removed with some difficulty, likely slightly imbedded in right side of uterus but released with gentle traction over several minutes.  IUD intact.  See procedure note.   --Mirena IUD inserted without difficulty, see note above.   5. Encounter for screening mammogram for malignant neoplasm of breast  - MM 3D SCREENING MAMMOGRAM BILATERAL BREAST; Future     No follow-ups on file.   Sharen Counter, CNM 8:54 AM

## 2023-05-03 NOTE — Progress Notes (Signed)
 Pt. Presents for IUD removal/replacement. Pt would like pap and std testing

## 2023-05-08 LAB — CYTOLOGY - PAP
Comment: NEGATIVE
High risk HPV: NEGATIVE

## 2023-05-16 ENCOUNTER — Encounter: Payer: Self-pay | Admitting: Advanced Practice Midwife

## 2023-05-24 ENCOUNTER — Ambulatory Visit: Payer: Commercial Managed Care - PPO

## 2023-05-25 ENCOUNTER — Ambulatory Visit

## 2023-05-26 ENCOUNTER — Ambulatory Visit
Admission: RE | Admit: 2023-05-26 | Discharge: 2023-05-26 | Disposition: A | Discharge status: Home / Self Care | Source: Ambulatory Visit | Attending: Advanced Practice Midwife | Admitting: Advanced Practice Midwife

## 2023-05-26 DIAGNOSIS — Z1231 Encounter for screening mammogram for malignant neoplasm of breast: Secondary | ICD-10-CM

## 2023-05-30 ENCOUNTER — Ambulatory Visit: Payer: Commercial Managed Care - PPO | Admitting: Advanced Practice Midwife

## 2023-06-27 ENCOUNTER — Ambulatory Visit: Admitting: Advanced Practice Midwife

## 2023-06-27 NOTE — Progress Notes (Deleted)
   GYNECOLOGY CLINIC PROGRESS NOTE  History:  43 y.o. [id number] here at [facility] *** today for today for IUD string check; *** IUD was placed  ***. No complaints about the IUD, no concerning side effects.  The following portions of the patient's history were reviewed and updated as appropriate: allergies, current medications, past family history, past medical history, past social history, past surgical history and problem list. Last pap smear on *** was normal, *** HRHPV.  Review of Systems:  Pertinent items are noted in HPI.   Objective:  Physical Exam There were no vitals taken for this visit. Gen: NAD Abd: Soft, nontender and nondistended Pelvic: Normal appearing external genitalia; normal appearing vaginal mucosa and cervix.  IUD strings visualized, about *** cm in length outside cervix.   Assessment & Plan:  Normal IUD check. Patient to keep IUD in place for eight years; can come in for removal if she desires pregnancy within the next eight years. Routine preventative health maintenance measures emphasized.  No follow-ups on file.   Arlester Bence, CNM 8:34 AM
